# Patient Record
Sex: Male | Born: 1970 | Race: White | Hispanic: No | Marital: Married | Smoking: Never smoker
Health system: Southern US, Community
[De-identification: ages and names within clinical notes are randomized; demographics above are authoritative.]

## PROBLEM LIST (undated history)

## (undated) DIAGNOSIS — K219 Gastro-esophageal reflux disease without esophagitis: Secondary | ICD-10-CM

## (undated) DIAGNOSIS — Z86010 Personal history of colonic polyps: Secondary | ICD-10-CM

## (undated) DIAGNOSIS — Z9889 Other specified postprocedural states: Secondary | ICD-10-CM

## (undated) HISTORY — PX: COLONOSCOPY: SHX174

## (undated) HISTORY — DX: Other specified postprocedural states: Z98.890

## (undated) HISTORY — DX: Personal history of colonic polyps: Z86.010

## (undated) HISTORY — PX: KNEE ARTHROSCOPY W/ ACL RECONSTRUCTION: SHX1858

## (undated) HISTORY — DX: Gastro-esophageal reflux disease without esophagitis: K21.9

## (undated) HISTORY — PX: POLYPECTOMY: SHX149

---

## 2001-05-31 ENCOUNTER — Emergency Department (HOSPITAL_COMMUNITY): Admission: EM | Admit: 2001-05-31 | Discharge: 2001-05-31 | Payer: Self-pay | Admitting: Emergency Medicine

## 2001-05-31 ENCOUNTER — Encounter: Payer: Self-pay | Admitting: Emergency Medicine

## 2004-06-24 HISTORY — PX: COLONOSCOPY W/ POLYPECTOMY: SHX1380

## 2013-05-28 ENCOUNTER — Encounter: Payer: Self-pay | Admitting: Family Medicine

## 2013-05-28 ENCOUNTER — Ambulatory Visit: Payer: Self-pay | Admitting: Family Medicine

## 2013-05-28 VITALS — BP 122/84 | HR 76 | Temp 97.7°F | Resp 16 | Ht 68.5 in | Wt 179.4 lb

## 2013-05-28 DIAGNOSIS — Z0000 Encounter for general adult medical examination without abnormal findings: Secondary | ICD-10-CM

## 2013-05-28 DIAGNOSIS — Z0289 Encounter for other administrative examinations: Secondary | ICD-10-CM

## 2013-05-28 NOTE — Progress Notes (Signed)
Physical Exam  History: 42 year old man here for a physical examination. He has no major acute medical complaints. He's been healthy. 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 He does exercise. He is married and has children  Review of systems: All systems negative  Physical examination: Well-developed well-nourished man in no acute distress. TMs normal. Eyes PERR LA. Fundi benign. He just got glasses. His throat is clear. Neck supple without nodes thyromegaly. No carotid bruits. Chest clear to auscultation. Heart regular without murmurs gallops or arrhythmias. Abdomen soft without mass or tenderness. Normal male external genitalia. Testes descended. No hernias. Spine normal. Extremities unremarkable. Deep tendon reflexes symmetrical. Skin unremarkable.  Assessment: Normal physical examination 2 year DOT card  Plan: Paperwork for DOT

## 2015-06-06 ENCOUNTER — Ambulatory Visit (INDEPENDENT_AMBULATORY_CARE_PROVIDER_SITE_OTHER): Payer: Self-pay | Admitting: Family Medicine

## 2015-06-06 VITALS — BP 120/82 | HR 65 | Temp 97.6°F | Resp 16 | Ht 68.5 in | Wt 187.0 lb

## 2015-06-06 DIAGNOSIS — Z021 Encounter for pre-employment examination: Secondary | ICD-10-CM

## 2015-06-06 DIAGNOSIS — Z0000 Encounter for general adult medical examination without abnormal findings: Secondary | ICD-10-CM

## 2015-06-06 NOTE — Progress Notes (Signed)
This a 44 year old gentleman who works for himself and needs a DOT physical keep his DOT license. He's having no problems. He only uses glasses for corrective vision.  Objective: Please see form that is negative for review of systems and includes a normal physical  Assessment: Two-year certificate, needs glasses  Signed, Carola Frost.D.

## 2016-07-23 ENCOUNTER — Ambulatory Visit (INDEPENDENT_AMBULATORY_CARE_PROVIDER_SITE_OTHER): Payer: Self-pay | Admitting: Physician Assistant

## 2016-07-23 VITALS — BP 132/88 | HR 62 | Temp 98.1°F | Resp 18 | Ht 68.5 in | Wt 185.0 lb

## 2016-07-23 DIAGNOSIS — Z0289 Encounter for other administrative examinations: Secondary | ICD-10-CM

## 2016-07-23 NOTE — Patient Instructions (Signed)
     IF you received an x-ray today, you will receive an invoice from [facility]. Please contact [facility] at [phone] with questions or concerns regarding your invoice.   IF you received labwork today, you will receive an invoice from [facility]. Please contact [facility] at [phone] with questions or concerns regarding your invoice.   Our billing staff will not be able to assist you with questions regarding bills from these companies.  You will be contacted with the lab results as soon as they are available. The fastest way to get your results is to activate your My Chart account. Instructions are located on the last page of this paperwork. If you have not heard from us regarding the results in 2 weeks, please contact this office.     

## 2016-07-23 NOTE — Progress Notes (Signed)
Commercial Driver Medical Examination   Justin Kemp is a 46 y.o. male who presents today for a commercial driver fitness determination physical exam. The patient reports no problems today. In the past the patient reports receiving 2 year certificates. He denies focal neurological deficits, vision and hearing changes. He denies the habitual use of benzodiazepines and opioids.  No past medical history on file.  Current medications, family history, allergies, social history reviewed by me and exist elsewhere in the encounter.   Review of Systems  Constitutional: Negative for fever and malaise/fatigue.  Respiratory: Negative for cough.   Cardiovascular: Negative for chest pain.  Gastrointestinal: Negative for nausea.  Skin: Negative for rash.  Neurological: Negative for dizziness, tingling, tremors, sensory change, speech change, focal weakness, seizures and headaches.  Endo/Heme/Allergies: Negative for polydipsia.    Objective:     Vision/hearing:  Visual Acuity Screening   Right eye Left eye Both eyes  Without correction:     With correction: 20/20 -1 20/25  20/200 -1  Comments: Titmus 85% Colors 6/6  Hearing Screening Comments: Whisper test 9 ft  Applicant can recognize and distinguish among traffic control signals and devices showing standard red, green, and amber colors.  Corrective lenses required: Yes  Monocular Vision?: No  Hearing aid requirement: No  Physical Exam  Constitutional: He is oriented to person, place, and time. He appears well-developed. He does not appear ill.  Eyes: Conjunctivae and EOM are normal. Pupils are equal, round, and reactive to light.  Cardiovascular: Normal rate and regular rhythm.   Pulmonary/Chest: Effort normal.  Abdominal: He exhibits no distension.  Musculoskeletal: Normal range of motion.  Neurological: He is alert and oriented to person, place, and time. No cranial nerve deficit. Coordination normal.  Skin: Skin is warm and  dry. He is not diaphoretic.  Psychiatric: He has a normal mood and affect.  Nursing note and vitals reviewed.   BP 132/88   Pulse 62   Temp 98.1 F (36.7 C) (Oral)   Resp 18   Ht 5' 8.5" (1.74 m)   Wt 185 lb (83.9 kg)   SpO2 97%   BMI 27.72 kg/m   Labs: Comments: Urinalysis: Blood - NEG, Glucose - NEG, Protein - NEG, SG - 1.010  Assessment:    Healthy male exam.  Meets standards in 2 CFR 391.41;  qualifies for 2 year certificate.    Plan:    Medical examiners certificate completed and printed. Return as needed.

## 2016-08-12 ENCOUNTER — Ambulatory Visit (INDEPENDENT_AMBULATORY_CARE_PROVIDER_SITE_OTHER): Payer: Managed Care, Other (non HMO) | Admitting: Physician Assistant

## 2016-08-12 ENCOUNTER — Encounter: Payer: Self-pay | Admitting: Physician Assistant

## 2016-08-12 VITALS — BP 124/79 | HR 70 | Temp 98.5°F | Resp 18 | Ht 68.5 in | Wt 180.6 lb

## 2016-08-12 DIAGNOSIS — Z131 Encounter for screening for diabetes mellitus: Secondary | ICD-10-CM

## 2016-08-12 DIAGNOSIS — Z1329 Encounter for screening for other suspected endocrine disorder: Secondary | ICD-10-CM

## 2016-08-12 DIAGNOSIS — Z114 Encounter for screening for human immunodeficiency virus [HIV]: Secondary | ICD-10-CM

## 2016-08-12 DIAGNOSIS — Z13220 Encounter for screening for lipoid disorders: Secondary | ICD-10-CM

## 2016-08-12 DIAGNOSIS — Z860100 Personal history of colon polyps, unspecified: Secondary | ICD-10-CM

## 2016-08-12 DIAGNOSIS — Z0000 Encounter for general adult medical examination without abnormal findings: Secondary | ICD-10-CM

## 2016-08-12 DIAGNOSIS — Z1211 Encounter for screening for malignant neoplasm of colon: Secondary | ICD-10-CM

## 2016-08-12 DIAGNOSIS — Z130 Encounter for screening for diseases of the blood and blood-forming organs and certain disorders involving the immune mechanism: Secondary | ICD-10-CM | POA: Diagnosis not present

## 2016-08-12 DIAGNOSIS — Z1389 Encounter for screening for other disorder: Secondary | ICD-10-CM | POA: Diagnosis not present

## 2016-08-12 DIAGNOSIS — Z9889 Other specified postprocedural states: Secondary | ICD-10-CM | POA: Diagnosis not present

## 2016-08-12 DIAGNOSIS — Z800 Family history of malignant neoplasm of digestive organs: Secondary | ICD-10-CM

## 2016-08-12 HISTORY — DX: Personal history of colon polyps, unspecified: Z86.0100

## 2016-08-12 HISTORY — DX: Personal history of colonic polyps: Z86.010

## 2016-08-12 NOTE — Progress Notes (Signed)
08/12/2016 2:03 PM   DOB: 1970/07/02 / MRN: [id number]  SUBJECTIVE:  Justin Kemp is a 46 y.o. male presenting for annual exam.  Denies family history of prostate cancer, CAD.  Father with a history of colon cancer at age around age 97 and is still alive today.  Pt had a colonoscopy at age 65 due to father and he had three polyps removed.  Is very concerned about his risk. Mother with questionable lupus vs. Fibromyalgia.  Never smoker. Sexually active with wife of 25 years.  Two boys at age 93 and 2.  Everyone is healthy in the family.  He is happy in his relationship with his wife however there is some stress.   Depression screen PHQ 2/9 08/12/2016  Decreased Interest 0  Down, Depressed, Hopeless 0  PHQ - 2 Score 0    He has No Known Allergies.   He drinks one to two beers weekly.  No illicit drugs.   Review of Systems  Constitutional: Negative for chills and fever.  Cardiovascular: Negative for chest pain.  Gastrointestinal: Negative for abdominal pain, heartburn and nausea.  Genitourinary: Negative for dysuria, frequency and urgency.  Musculoskeletal: Negative for myalgias.  Skin: Negative for rash.  Neurological: Negative for dizziness.  Psychiatric/Behavioral: Negative for depression. The patient is not nervous/anxious.     The problem list and medications were reviewed and updated by myself where necessary and exist elsewhere in the encounter.   OBJECTIVE:  BP 124/79   Pulse 70   Temp 98.5 F (36.9 C) (Oral)   Resp 18   Ht 5' 8.5" (1.74 m)   Wt 180 lb 9.6 oz (81.9 kg)   SpO2 98%   BMI 27.06 kg/m   Wt Readings from Last 3 Encounters:  08/12/16 180 lb 9.6 oz (81.9 kg)  07/23/16 185 lb (83.9 kg)  06/06/15 187 lb (84.8 kg)     Physical Exam  Constitutional: He is oriented to person, place, and time. He appears well-developed and well-nourished. No distress.  HENT:  Mouth/Throat: No oropharyngeal exudate.  Cardiovascular: Normal rate, regular rhythm and normal  heart sounds.  Exam reveals no gallop and no friction rub.   No murmur heard. Pulmonary/Chest: Effort normal and breath sounds normal.  Abdominal: Soft. Bowel sounds are normal.  Musculoskeletal: Normal range of motion.  Neurological: He is alert and oriented to person, place, and time.  Skin: Skin is warm and dry. He is not diaphoretic.  Psychiatric: He has a normal mood and affect.    No results found for this or any previous visit (from the past 72 hour(s)).  No results found.  ASSESSMENT AND PLAN:  Ewald was seen today for annual exam.  Diagnoses and all orders for this visit:  Annual physical exam  Screening for deficiency anemia -     CBC  Screening for nephropathy -     CMP14+EGFR  Screening for lipid disorders -     Lipid panel  Screening for thyroid disorder -     TSH  Screening for diabetes mellitus -     Hemoglobin A1c  Screening for HIV (human immunodeficiency virus) -     HIV antibody  Special screening for malignant neoplasms, colon: father with colon cancer at age 8 or so still alive today.  Patient with history of three polyps removed about 10 years ago.  I am sending him back for repeat.        -     Ambulatory referral to Gastroenterology  The patient is advised to call or return to clinic if he does not see an improvement in symptoms, or to seek the care of the closest emergency department if he worsens with the above plan.   Philis Fendt, MHS, PA-C [facility] 08/12/2016 2:03 PM

## 2016-08-12 NOTE — Patient Instructions (Signed)
     IF you received an x-ray today, you will receive an invoice from [facility]. Please contact [facility] at [phone] with questions or concerns regarding your invoice.   IF you received labwork today, you will receive an invoice from [facility]. Please contact [facility] at [phone] with questions or concerns regarding your invoice.   Our billing staff will not be able to assist you with questions regarding bills from these companies.  You will be contacted with the lab results as soon as they are available. The fastest way to get your results is to activate your My Chart account. Instructions are located on the last page of this paperwork. If you have not heard from us regarding the results in 2 weeks, please contact this office.     

## 2016-08-13 LAB — CMP14+EGFR
ALT: 25 IU/L (ref 0–44)
AST: 23 IU/L (ref 0–40)
Albumin/Globulin Ratio: 1.7 (ref 1.2–2.2)
Albumin: 4.5 g/dL (ref 3.5–5.5)
Alkaline Phosphatase: 74 IU/L (ref 39–117)
BUN/Creatinine Ratio: 12 (ref 9–20)
BUN: 13 mg/dL (ref 6–24)
Bilirubin Total: 0.3 mg/dL (ref 0.0–1.2)
CO2: 24 mmol/L (ref 18–29)
Calcium: 9.7 mg/dL (ref 8.7–10.2)
Chloride: 101 mmol/L (ref 96–106)
Creatinine, Ser: 1.11 mg/dL (ref 0.76–1.27)
GFR calc Af Amer: 91 mL/min/{1.73_m2} (ref >59)
GFR calc non Af Amer: 79 mL/min/{1.73_m2} (ref >59)
Globulin, Total: 2.7 g/dL (ref 1.5–4.5)
Glucose: 87 mg/dL (ref 65–99)
Potassium: 4.3 mmol/L (ref 3.5–5.2)
Sodium: 141 mmol/L (ref 134–144)
Total Protein: 7.2 g/dL (ref 6.0–8.5)

## 2016-08-13 LAB — LIPID PANEL
Chol/HDL Ratio: 4.6 ratio units (ref 0.0–5.0)
Cholesterol, Total: 183 mg/dL (ref 100–199)
HDL: 40 mg/dL (ref >39)
LDL Calculated: 121 mg/dL — ABNORMAL HIGH (ref 0–99)
Triglycerides: 111 mg/dL (ref 0–149)
VLDL Cholesterol Cal: 22 mg/dL (ref 5–40)

## 2016-08-13 LAB — HEMOGLOBIN A1C
Est. average glucose Bld gHb Est-mCnc: 103 mg/dL
Hgb A1c MFr Bld: 5.2 % (ref 4.8–5.6)

## 2016-08-13 LAB — TSH: TSH: 1.81 u[IU]/mL (ref 0.450–4.500)

## 2016-08-13 LAB — CBC
Hematocrit: 45.8 % (ref 37.5–51.0)
Hemoglobin: 15.1 g/dL (ref 13.0–17.7)
MCH: 29 pg (ref 26.6–33.0)
MCHC: 33 g/dL (ref 31.5–35.7)
MCV: 88 fL (ref 79–97)
Platelets: 249 10*3/uL (ref 150–379)
RBC: 5.21 x10E6/uL (ref 4.14–5.80)
RDW: 14.2 % (ref 12.3–15.4)
WBC: 5 10*3/uL (ref 3.4–10.8)

## 2016-08-13 LAB — HIV ANTIBODY (ROUTINE TESTING W REFLEX): HIV Screen 4th Generation wRfx: NONREACTIVE

## 2016-08-13 NOTE — Progress Notes (Signed)
These result look great.  RTC in two years for physical exam.

## 2016-09-17 ENCOUNTER — Encounter (INDEPENDENT_AMBULATORY_CARE_PROVIDER_SITE_OTHER): Payer: Self-pay

## 2016-09-17 ENCOUNTER — Encounter: Payer: Self-pay | Admitting: Gastroenterology

## 2016-09-17 ENCOUNTER — Ambulatory Visit (INDEPENDENT_AMBULATORY_CARE_PROVIDER_SITE_OTHER): Payer: Managed Care, Other (non HMO) | Admitting: Gastroenterology

## 2016-09-17 VITALS — BP 100/80 | HR 80 | Ht 68.5 in | Wt 183.5 lb

## 2016-09-17 DIAGNOSIS — Z800 Family history of malignant neoplasm of digestive organs: Secondary | ICD-10-CM | POA: Diagnosis not present

## 2016-09-17 DIAGNOSIS — Z86010 Personal history of colonic polyps: Secondary | ICD-10-CM

## 2016-09-17 MED ORDER — NA SULFATE-K SULFATE-MG SULF 17.5-3.13-1.6 GM/177ML PO SOLN: 1.0000 | Freq: Once | ORAL | 0 refills | Status: AC

## 2016-09-17 NOTE — Progress Notes (Signed)
HPI :  46 y/o male with reported history of colon polyps, knee arthroscopy, family history of colon cancer, here for a new patient visit to discuss having a colonoscopy.   Father had colon cancer at age 5s, he is not sure of exact age. He reportedly had a prior colonoscopy with polyp removed at age 38, he is not sure where this was done. He had 3 polyps removed, he is not sure what type they were.   No blood in the stools althought he thinks he may have hemorrhoids which occasionally causes some blood to be noted on the tissue paper. No constipation or diarrhea. Some foods can reliably cause cause bloating and discomfort at times, if he avoids these he doesn't have any symptoms. No weight loss. Eating okay, no nausea or vomiting, good appetite.  No history of anemia.   Past Medical History:  Diagnosis Date  . H/O colonoscopy with polypectomy 08/12/2016    Past Surgical History:  Procedure Laterality Date  . COLONOSCOPY W/ POLYPECTOMY  2006  . KNEE ARTHROSCOPY W/ ACL RECONSTRUCTION Left    Family History  Problem Relation Age of Onset  . Arthritis Mother   . Colon cancer Father 61  . Cancer Maternal Grandmother     type unknown  . Lung cancer Maternal Grandfather     smoker   Social History  Substance Use Topics  . Smoking status: Never Smoker  . Smokeless tobacco: Never Used  . Alcohol use 1.8 oz/week    3 Cans of beer per week   No current outpatient prescriptions on file.   No current facility-administered medications for this visit.    No Known Allergies   Review of Systems: All systems reviewed and negative except where noted in HPI.   Lab Results  Component Value Date   WBC 5.0 08/12/2016   HCT 45.8 08/12/2016   MCV 88 08/12/2016   PLT 249 08/12/2016    Lab Results  Component Value Date   CREATININE 1.11 08/12/2016   BUN 13 08/12/2016   NA 141 08/12/2016   K 4.3 08/12/2016   CL 101 08/12/2016   CO2 24 08/12/2016    Lab Results  Component Value  Date   ALT 25 08/12/2016   AST 23 08/12/2016   ALKPHOS 74 08/12/2016   BILITOT 0.3 08/12/2016     Physical Exam: BP 100/80 (BP Location: Left Arm, Patient Position: Sitting, Cuff Size: Normal)   Pulse 80   Ht 5' 8.5" (1.74 m) Comment: height measured without shoes  Wt 183 lb 8 oz (83.2 kg)   BMI 27.50 kg/m  Constitutional: Pleasant,well-developed, male in no acute distress. HEENT: Normocephalic and atraumatic. Conjunctivae are normal. No scleral icterus. Neck supple.  Cardiovascular: Normal rate, regular rhythm.  Pulmonary/chest: Effort normal and breath sounds normal. No wheezing, rales or rhonchi. Abdominal: Soft, nondistended, nontender.  There are no masses palpable. No hepatomegaly. Extremities: no edema Lymphadenopathy: No cervical adenopathy noted. Neurological: Alert and oriented to person place and time. Skin: Skin is warm and dry. No rashes noted. Psychiatric: Normal mood and affect. Behavior is normal.   ASSESSMENT AND PLAN: 46 y/o male with a reported history of colon polyps on colonoscopy done roughly 9 years ago (details unknown) with a family history of colon cancer, who is presenting for colonoscopy evaluation. We discussed his family history and guidelines for colon cancer screening recommendations based on family history of colon cancer. We discussed that if his father had colon cancer diagnosed at age >  29, and if he had no adenomas on his last exam, he would not be due for 10 years from his last exam. However, he is not exactly sure what age his father was diagnosed and what his last colonoscopy showed, and wished to proceed with colonoscopy now. Following discussion of risks / benefits of the exam, he agreed to proceed. Will try to obtain records from his prior colonoscopy to clarify findings if possible in the interim.   Monterey Cellar, MD [facility] Pager [phone]  CC: Tereasa Coop, PA-C

## 2016-09-17 NOTE — Patient Instructions (Signed)
If you are age 48 or older, your body mass index should be between 23-30. Your Body mass index is 27.5 kg/m. If this is out of the aforementioned range listed, please consider follow up with your Primary Care Provider.  If you are age 60 or younger, your body mass index should be between 19-25. Your Body mass index is 27.5 kg/m. If this is out of the aformentioned range listed, please consider follow up with your Primary Care Provider.   You have been scheduled for a colonoscopy. Please follow written instructions given to you at your visit today.  Please pick up your prep supplies at the pharmacy within the next 1-3 days. If you use inhalers (even only as needed), please bring them with you on the day of your procedure. Your physician has requested that you go to www.startemmi.com and enter the access code given to you at your visit today. This web site gives a general overview about your procedure. However, you should still follow specific instructions given to you by our office regarding your preparation for the procedure.   We have sent the following medications to your pharmacy for you to pick up at your convenience:  Suprep  Thank you.

## 2016-09-25 ENCOUNTER — Telehealth: Payer: Self-pay | Admitting: Gastroenterology

## 2016-09-25 NOTE — Telephone Encounter (Signed)
Colonoscopy report obtained from prior records as follows:  Date: 05/26/2006 by Dr. Teena Irani Findings: 2 x 44mm polyps, one adenoma, one hyperplastic  Patient has already been scheduled for colonoscopy, as he is overdue for surveillance given his history of an adenoma. We will await the results.

## 2016-10-21 ENCOUNTER — Encounter: Payer: Self-pay | Admitting: Gastroenterology

## 2016-10-31 ENCOUNTER — Encounter: Payer: Self-pay | Admitting: Gastroenterology

## 2016-11-01 ENCOUNTER — Ambulatory Visit (AMBULATORY_SURGERY_CENTER): Payer: Managed Care, Other (non HMO) | Admitting: Gastroenterology

## 2016-11-01 ENCOUNTER — Encounter: Payer: Self-pay | Admitting: Gastroenterology

## 2016-11-01 VITALS — BP 104/69 | HR 68 | Temp 98.2°F | Resp 15 | Ht 68.5 in | Wt 183.0 lb

## 2016-11-01 DIAGNOSIS — Z1212 Encounter for screening for malignant neoplasm of rectum: Secondary | ICD-10-CM

## 2016-11-01 DIAGNOSIS — Z1211 Encounter for screening for malignant neoplasm of colon: Secondary | ICD-10-CM | POA: Diagnosis not present

## 2016-11-01 DIAGNOSIS — K635 Polyp of colon: Secondary | ICD-10-CM

## 2016-11-01 DIAGNOSIS — D128 Benign neoplasm of rectum: Secondary | ICD-10-CM

## 2016-11-01 DIAGNOSIS — Z800 Family history of malignant neoplasm of digestive organs: Secondary | ICD-10-CM

## 2016-11-01 DIAGNOSIS — D123 Benign neoplasm of transverse colon: Secondary | ICD-10-CM | POA: Diagnosis not present

## 2016-11-01 MED ORDER — SODIUM CHLORIDE 0.9 % IV SOLN: 500.0000 mL | INTRAVENOUS | Status: DC

## 2016-11-01 NOTE — Op Note (Signed)
[facility] Patient Name: Justin Kemp Procedure Date: 11/01/2016 3:33 PM MRN: [id number] Endoscopist: Remo Lipps P. Landa [name] MD, MD Age: 46 Referring MD:  Date of Birth: 10/07/1970 Gender: Male Account #: [id number] Procedure:                Colonoscopy Indications:              Screening in patient at increased risk: Family                            history of 1st-degree relative with colorectal                            cancer (father around age 78), Personal history of                            colonic adenomas Medicines:                Monitored Anesthesia Care Procedure:                Pre-Anesthesia Assessment:                           - Prior to the procedure, a History and Physical                            was performed, and patient medications and                            allergies were reviewed. The patient's tolerance of                            previous anesthesia was also reviewed. The risks                            and benefits of the procedure and the sedation                            options and risks were discussed with the patient.                            All questions were answered, and informed consent                            was obtained. Prior Anticoagulants: The patient has                            taken no previous anticoagulant or antiplatelet                            agents. ASA Grade Assessment: II - A patient with                            mild systemic disease. After reviewing the risks  and benefits, the patient was deemed in                            satisfactory condition to undergo the procedure.                           After obtaining informed consent, the colonoscope                            was passed under direct vision. Throughout the                            procedure, the patient's blood pressure, pulse, and                            oxygen saturations were monitored  continuously. The                            Colonoscope was introduced through the anus and                            advanced to the the cecum, identified by                            appendiceal orifice and ileocecal valve. The                            colonoscopy was performed without difficulty. The                            patient tolerated the procedure well. The quality                            of the bowel preparation was good. The ileocecal                            valve, appendiceal orifice, and rectum were                            photographed. Scope In: 3:37:57 PM Scope Out: 4:01:51 PM Scope Withdrawal Time: 0 hours 20 minutes 49 seconds  Total Procedure Duration: 0 hours 23 minutes 54 seconds  Findings:                 The perianal and digital rectal examinations were                            normal.                           A 4 mm polyp was found in the transverse colon. The                            polyp was sessile. The polyp was removed with a  cold snare. Resection and retrieval were complete.                           A 3 mm polyp was found in the rectum. The polyp was                            sessile. The polyp was removed with a cold snare.                            Resection and retrieval were complete.                           A 10 mm polyp was found in the rectum. The polyp                            was flat and not easily able to be snared in                            entirety without lifting. Area was successfully                            injected with saline for a lift polypectomy. The                            polyp was removed with a hot snare en bloc.                            Resection and retrieval were complete.                           The exam was otherwise without abnormality on                            direct and retroflexion views. Complications:            No immediate complications. Estimated  blood loss:                            Minimal. Estimated Blood Loss:     Estimated blood loss was minimal. Impression:               - One 4 mm polyp in the transverse colon, removed                            with a cold snare. Resected and retrieved.                           - One 3 mm polyp in the rectum, removed with a cold                            snare. Resected and retrieved.                           -  One 10 mm polyp in the rectum, removed with a hot                            snare following saline lift. Resected and retrieved.                           - The examination was otherwise normal on direct                            and retroflexion views. Recommendation:           - Patient has a contact number available for                            emergencies. The signs and symptoms of potential                            delayed complications were discussed with the                            patient. Return to normal activities tomorrow.                            Written discharge instructions were provided to the                            patient.                           - Resume previous diet.                           - Continue present medications.                           - Await pathology results.                           - Repeat colonoscopy is recommended for                            surveillance. The colonoscopy date will be                            determined after pathology results from today's                            exam become available for review.                           - No ibuprofen, naproxen, or other non-steroidal                            anti-inflammatory drugs for 2 weeks after polyp  removal. Remo Lipps P. [name] MD, MD 11/01/2016 4:07:08 PM This report has been signed electronically.

## 2016-11-01 NOTE — Patient Instructions (Signed)
Discharge instructions given. Handout on polyps. Resume previous medications. No ibuprofen, naproxen, or other NSAIDs for two weeks. YOU HAD AN ENDOSCOPIC PROCEDURE TODAY AT THE [facility]:   Refer to the procedure report that was given to you for any specific questions about what was found during the examination.  If the procedure report does not answer your questions, please call your gastroenterologist to clarify.  If you requested that your care partner not be given the details of your procedure findings, then the procedure report has been included in a sealed envelope for you to review at your convenience later.  YOU SHOULD EXPECT: Some feelings of bloating in the abdomen. Passage of more gas than usual.  Walking can help get rid of the air that was put into your GI tract during the procedure and reduce the bloating. If you had a lower endoscopy (such as a colonoscopy or flexible sigmoidoscopy) you may notice spotting of blood in your stool or on the toilet paper. If you underwent a bowel prep for your procedure, you may not have a normal bowel movement for a few days.  Please Note:  You might notice some irritation and congestion in your nose or some drainage.  This is from the oxygen used during your procedure.  There is no need for concern and it should clear up in a day or so.  SYMPTOMS TO REPORT IMMEDIATELY:   Following lower endoscopy (colonoscopy or flexible sigmoidoscopy):  Excessive amounts of blood in the stool  Significant tenderness or worsening of abdominal pains  Swelling of the abdomen that is new, acute  Fever of 100F or higher  For urgent or emergent issues, a gastroenterologist can be reached at any hour by calling [phone].   DIET:  We do recommend a small meal at first, but then you may proceed to your regular diet.  Drink plenty of fluids but you should avoid alcoholic beverages for 24 hours.  ACTIVITY:  You should plan to take it easy for the  rest of today and you should NOT DRIVE or use heavy machinery until tomorrow (because of the sedation medicines used during the test).    FOLLOW UP: Our staff will call the number listed on your records the next business day following your procedure to check on you and address any questions or concerns that you may have regarding the information given to you following your procedure. If we do not reach you, we will leave a message.  However, if you are feeling well and you are not experiencing any problems, there is no need to return our call.  We will assume that you have returned to your regular daily activities without incident.  If any biopsies were taken you will be contacted by phone or by letter within the next 1-3 weeks.  Please call us at [phone] if you have not heard about the biopsies in 3 weeks.    SIGNATURES/CONFIDENTIALITY: You and/or your care partner have signed paperwork which will be entered into your electronic medical record.  These signatures attest to the fact that that the information above on your After Visit Summary has been reviewed and is understood.  Full responsibility of the confidentiality of this discharge information lies with you and/or your care-partner. 

## 2016-11-01 NOTE — Progress Notes (Signed)
Pt's states no medical or surgical changes since previsit or office visit. 

## 2016-11-01 NOTE — Progress Notes (Signed)
Called to room to assist during endoscopic procedure.  Patient ID and intended procedure confirmed with present staff. Received instructions for my participation in the procedure from the performing physician.  

## 2016-11-01 NOTE — Progress Notes (Signed)
A/ox3 pleased with MAC, report to Celia RN 

## 2016-11-04 ENCOUNTER — Telehealth: Payer: Self-pay

## 2016-11-04 NOTE — Telephone Encounter (Signed)
  Follow up Call-  Call back number 11/01/2016  Post procedure Call Back phone  # [phone]  Permission to leave phone message Yes  Some recent data might be hidden     Patient questions:  Do you have a fever, pain , or abdominal swelling? No. Pain Score  0 *  Have you tolerated food without any problems? Yes.    Have you been able to return to your normal activities? Yes.    Do you have any questions about your discharge instructions: Diet   No. Medications  No. Follow up visit  No.  Do you have questions or concerns about your Care? No.  Actions: * If pain score is 4 or above: No action needed, pain <4.

## 2016-11-27 ENCOUNTER — Encounter: Payer: Self-pay | Admitting: Gastroenterology

## 2016-12-30 ENCOUNTER — Telehealth: Payer: Self-pay | Admitting: Physician Assistant

## 2016-12-30 NOTE — Telephone Encounter (Signed)
Spoke with patient, states that he needs physical form for the [employer] filled out. Patient had a physical 08/12/2016, would like to know if form can be filled out based on that visit. Spoke with PA Bess Harvest who is covering for [employer], agreed to fill out and sign form. Form completed, call placed to patient to pick up. Patient states that he will pick up today./ S.Jvon Meroney,CMA

## 2016-12-30 NOTE — Telephone Encounter (Signed)
Pt requesting Justin Kemp complete form from his recent [employer]   Best phone for pt is [phone]

## 2016-12-31 ENCOUNTER — Ambulatory Visit (INDEPENDENT_AMBULATORY_CARE_PROVIDER_SITE_OTHER): Payer: Managed Care, Other (non HMO) | Admitting: Urgent Care

## 2016-12-31 ENCOUNTER — Encounter: Payer: Self-pay | Admitting: Urgent Care

## 2016-12-31 VITALS — BP 118/75 | HR 62 | Temp 98.1°F | Resp 18 | Ht 68.5 in | Wt 182.8 lb

## 2016-12-31 DIAGNOSIS — Z23 Encounter for immunization: Secondary | ICD-10-CM | POA: Diagnosis not present

## 2016-12-31 NOTE — Progress Notes (Signed)
Patient was seen on 07/2016 for an complete physical with Philis Fendt, PA-C.  He returned today to have his Tdap immunization that was needed for [employer].  Immunization was an add on to previous OV of CPE.  Patient was made aware that he may experience soreness of the arm where the injection was placed.  He verbalized understanding. Denied any medication allergies.  Patient received injection in right deltoid and had no complications afterwards before leaving office.

## 2016-12-31 NOTE — Patient Instructions (Signed)
     IF you received an x-ray today, you will receive an invoice from [facility]. Please contact [facility] at [phone] with questions or concerns regarding your invoice.   IF you received labwork today, you will receive an invoice from [place]. Please contact [facility] at [phone] with questions or concerns regarding your invoice.   Our billing staff will not be able to assist you with questions regarding bills from these companies.  You will be contacted with the lab results as soon as they are available. The fastest way to get your results is to activate your My Chart account. Instructions are located on the last page of this paperwork. If you have not heard from [place] regarding the results in 2 weeks, please contact this office.     il

## 2017-01-28 ENCOUNTER — Ambulatory Visit (AMBULATORY_SURGERY_CENTER): Payer: Self-pay

## 2017-01-28 VITALS — Ht 68.0 in | Wt 186.0 lb

## 2017-01-28 DIAGNOSIS — Z860100 Personal history of colon polyps, unspecified: Secondary | ICD-10-CM

## 2017-01-28 NOTE — Progress Notes (Signed)
No allergies to eggs or soy No diet meds No home oxygen No past problems with anesthesia  Declined emmi 

## 2017-01-30 ENCOUNTER — Encounter: Payer: Self-pay | Admitting: Gastroenterology

## 2017-02-10 ENCOUNTER — Ambulatory Visit (AMBULATORY_SURGERY_CENTER): Payer: Managed Care, Other (non HMO) | Admitting: Gastroenterology

## 2017-02-10 ENCOUNTER — Encounter: Payer: Self-pay | Admitting: Gastroenterology

## 2017-02-10 VITALS — BP 101/74 | HR 71 | Temp 98.6°F | Resp 11 | Ht 68.0 in | Wt 186.0 lb

## 2017-02-10 DIAGNOSIS — Z800 Family history of malignant neoplasm of digestive organs: Secondary | ICD-10-CM

## 2017-02-10 DIAGNOSIS — Z86010 Personal history of colonic polyps: Secondary | ICD-10-CM

## 2017-02-10 DIAGNOSIS — K6289 Other specified diseases of anus and rectum: Secondary | ICD-10-CM | POA: Diagnosis not present

## 2017-02-10 MED ORDER — SODIUM CHLORIDE 0.9 % IV SOLN: 500.0000 mL | INTRAVENOUS | Status: DC

## 2017-02-10 NOTE — Progress Notes (Signed)
Pt's states no medical or surgical changes since previsit or office visit. 

## 2017-02-10 NOTE — Op Note (Signed)
[facility] Patient Name: Justin Kemp Procedure Date: 02/10/2017 8:29 AM MRN: [id number] Endoscopist: Remo Lipps P. Cyncere Sontag MD, MD Age: 46 Referring MD:  Date of Birth: 1970/07/09 Gender: Male Account #: [id number] Procedure:                Flexible Sigmoidoscopy Indications:              High risk colon cancer surveillance: Personal                            history of colonic polyps - 1cm flat polyp with                            high grade dysplasia removed from the rectum 3                            months ago Medicines:                Monitored Anesthesia Care Procedure:                Pre-Anesthesia Assessment:                           - Prior to the procedure, a History and Physical                            was performed, and patient medications and                            allergies were reviewed. The patient's tolerance of                            previous anesthesia was also reviewed. The risks                            and benefits of the procedure and the sedation                            options and risks were discussed with the patient.                            All questions were answered, and informed consent                            was obtained. Prior Anticoagulants: The patient has                            taken no previous anticoagulant or antiplatelet                            agents. ASA Grade Assessment: I - A normal, healthy                            patient. After reviewing the risks and benefits,  the patient was deemed in satisfactory condition to                            undergo the procedure.                           After obtaining informed consent, the scope was                            passed under direct vision. The Model PCF-H190DL                            [id number]) scope was introduced through the anus                            and advanced to the the sigmoid colon. The flexible                         sigmoidoscopy was accomplished without difficulty.                            The patient tolerated the procedure well. The                            quality of the bowel preparation was adequate. Scope In: 8:34:33 AM Scope Out: 8:44:00 AM Total Procedure Duration: 0 hours 9 minutes 27 seconds  Findings:                 The perianal and digital rectal examinations were                            normal.                           A medium post polypectomy scar was found in the                            distal rectum. The scar tissue was healthy in                            appearance, no obvious residual polyp. Biopsies                            were taken from the base of the scar with a cold                            forceps for histology. With the polypectomy site                            placed at the 6 o'clock position, a tattoo was                            placed to the left lateral side of the area with  spot ink.                           The exam was otherwise normal throughout the                            examined colon (sigmoid colon reached). Complications:            No immediate complications. Estimated blood loss:                            Minimal. Estimated Blood Loss:     Estimated blood loss was minimal. Impression:               - Post-polypectomy scar in the rectum - site                            appeared healthy with no evidence of residual                            polyp. Biopsied. Tattooed. Recommendation:           - Discharge patient to home.                           - Resume previous diet.                           - Continue present medications.                           - Await pathology results.                           - Repeat colonoscopy in 3 years pending pathology                            results normal Jinny Sweetland P. Bita Cartwright MD, MD 02/10/2017 8:50:11 AM This report has been signed  electronically.

## 2017-02-10 NOTE — Progress Notes (Signed)
Called to room to assist during endoscopic procedure.  Patient ID and intended procedure confirmed with present staff. Received instructions for my participation in the procedure from the performing physician.  

## 2017-02-10 NOTE — Progress Notes (Signed)
To recovery, report to RN, VSS. 

## 2017-02-10 NOTE — Patient Instructions (Signed)
Biopsies taken today. Result letter in your mail in 2-3 weeks. Repeat colonoscopy in 3 years depending on path results. Call us with any questions or concerns. Thank you!   YOU HAD AN ENDOSCOPIC PROCEDURE TODAY AT [facility]:   Refer to the procedure report that was given to you for any specific questions about what was found during the examination.  If the procedure report does not answer your questions, please call your gastroenterologist to clarify.  If you requested that your care partner not be given the details of your procedure findings, then the procedure report has been included in a sealed envelope for you to review at your convenience later.  YOU SHOULD EXPECT: Some feelings of bloating in the abdomen. Passage of more gas than usual.  Walking can help get rid of the air that was put into your GI tract during the procedure and reduce the bloating. If you had a lower endoscopy (such as a colonoscopy or flexible sigmoidoscopy) you may notice spotting of blood in your stool or on the toilet paper. If you underwent a bowel prep for your procedure, you may not have a normal bowel movement for a few days.  Please Note:  You might notice some irritation and congestion in your nose or some drainage.  This is from the oxygen used during your procedure.  There is no need for concern and it should clear up in a day or so.  SYMPTOMS TO REPORT IMMEDIATELY:   Following lower endoscopy (colonoscopy or flexible sigmoidoscopy):  Excessive amounts of blood in the stool  Significant tenderness or worsening of abdominal pains  Swelling of the abdomen that is new, acute  Fever of 100F or higher  For urgent or emergent issues, a gastroenterologist can be reached at any hour by calling [phone].   DIET:  We do recommend a small meal at first, but then you may proceed to your regular diet.  Drink plenty of fluids but you should avoid alcoholic beverages for 24 hours.  ACTIVITY:   You should plan to take it easy for the rest of today and you should NOT DRIVE or use heavy machinery until tomorrow (because of the sedation medicines used during the test).    FOLLOW UP: Our staff will call the number listed on your records the next business day following your procedure to check on you and address any questions or concerns that you may have regarding the information given to you following your procedure. If we do not reach you, we will leave a message.  However, if you are feeling well and you are not experiencing any problems, there is no need to return our call.  We will assume that you have returned to your regular daily activities without incident.  If any biopsies were taken you will be contacted by phone or by letter within the next 1-3 weeks.  Please call us at [phone] if you have not heard about the biopsies in 3 weeks.    SIGNATURES/CONFIDENTIALITY: You and/or your care partner have signed paperwork which will be entered into your electronic medical record.  These signatures attest to the fact that that the information above on your After Visit Summary has been reviewed and is understood.  Full responsibility of the confidentiality of this discharge information lies with you and/or your care-partner.

## 2017-02-11 ENCOUNTER — Telehealth: Payer: Self-pay | Admitting: *Deleted

## 2017-02-11 NOTE — Telephone Encounter (Signed)
  Follow up Call-  Call back number 02/10/2017 11/01/2016  Post procedure Call Back phone  # [phone]  Permission to leave phone message Yes Yes  Some recent data might be hidden     Patient questions:  Do you have a fever, pain , or abdominal swelling? No. Pain Score  0 *  Have you tolerated food without any problems? Yes.    Have you been able to return to your normal activities? Yes.    Do you have any questions about your discharge instructions: Diet   No. Medications  No. Follow up visit  No.  Do you have questions or concerns about your Care? No.  Actions: * If pain score is 4 or above: No action needed, pain <4.

## 2017-02-13 ENCOUNTER — Encounter: Payer: Self-pay | Admitting: Gastroenterology

## 2017-11-21 ENCOUNTER — Ambulatory Visit (INDEPENDENT_AMBULATORY_CARE_PROVIDER_SITE_OTHER): Payer: Managed Care, Other (non HMO)

## 2017-11-21 ENCOUNTER — Other Ambulatory Visit: Payer: Self-pay

## 2017-11-21 ENCOUNTER — Ambulatory Visit: Payer: Managed Care, Other (non HMO) | Admitting: Physician Assistant

## 2017-11-21 ENCOUNTER — Encounter: Payer: Self-pay | Admitting: Physician Assistant

## 2017-11-21 VITALS — BP 110/80 | HR 74 | Temp 97.7°F | Resp 18 | Ht 68.0 in | Wt 183.6 lb

## 2017-11-21 DIAGNOSIS — R109 Unspecified abdominal pain: Secondary | ICD-10-CM | POA: Diagnosis not present

## 2017-11-21 LAB — POCT URINALYSIS DIP (MANUAL ENTRY)
BILIRUBIN UA: NEGATIVE
Ketones, POC UA: NEGATIVE mg/dL
Leukocytes, UA: NEGATIVE
Nitrite, UA: NEGATIVE
Protein Ur, POC: NEGATIVE mg/dL
RBC UA: NEGATIVE
SPEC GRAV UA: 1.025 (ref 1.010–1.025)
Urobilinogen, UA: 0.2 E.U./dL
pH, UA: 5.5 (ref 5.0–8.0)

## 2017-11-21 LAB — POCT GLYCOSYLATED HEMOGLOBIN (HGB A1C): Hemoglobin A1C: 5.8 % — AB (ref 4.0–5.6)

## 2017-11-21 LAB — POC MICROSCOPIC URINALYSIS (UMFC): Mucus: ABSENT

## 2017-11-21 NOTE — Progress Notes (Signed)
11/21/2017 3:13 PM   DOB: Feb 21, 1971 / MRN: [id number]  SUBJECTIVE:  Justin Kemp is a 47 y.o. male presenting for left sided flank pain times two weeks.  No pleuritic component, SOB, DOE, cough, nausea, chills.  No hematuria.  No new meds.  No GERD or bowel disturbance. Never smoker.  The 10-year ASCVD risk score [name] DC [name]., et al., 2013) is: 2.4%   Values used to calculate the score:     Age: 7 years     Sex: Male     Is Non-Hispanic African American: No     Diabetic: No     Tobacco smoker: No     Systolic Blood Pressure: 834 mmHg     Is BP treated: No     HDL Cholesterol: 40 mg/dL     Total Cholesterol: 183 mg/dL   He has No Known Allergies.   He  has a past medical history of H/O colonoscopy with polypectomy (08/12/2016).    He  reports that he has never smoked. He has never used smokeless tobacco. He reports that he drinks about 1.8 oz of alcohol per week. He reports that he does not use drugs. He  has no sexual activity history on file. The patient  has a past surgical history that includes Colonoscopy w/ polypectomy (2006) and Knee arthroscopy w/ ACL reconstruction (Left).  His family history includes Arthritis in his mother; Cancer in his maternal grandmother; Colon cancer (age of onset: 21) in his father; Lung cancer in his maternal grandfather.  Review of Systems  Constitutional: Negative for chills, diaphoresis and fever.  Eyes: Negative.   Respiratory: Negative for cough, hemoptysis, sputum production, shortness of breath and wheezing.   Cardiovascular: Negative for chest pain, orthopnea and leg swelling.  Gastrointestinal: Negative for abdominal pain, blood in stool, constipation, diarrhea, heartburn, melena, nausea and vomiting.  Genitourinary: Positive for flank pain. Negative for dysuria, frequency, hematuria and urgency.  Skin: Negative for rash.  Neurological: Negative for dizziness, sensory change, speech change, focal weakness and headaches.    The  problem list and medications were reviewed and updated by myself where necessary and exist elsewhere in the encounter.   OBJECTIVE:  BP 110/80 (BP Location: Right Arm, Patient Position: Sitting, Cuff Size: Normal)   Pulse 74   Temp 97.7 F (36.5 C) (Oral)   Resp 18   Ht 5\' 8"  (1.727 m)   Wt 183 lb 9.6 oz (83.3 kg)   SpO2 98%   BMI 27.92 kg/m   Wt Readings from Last 3 Encounters:  11/21/17 183 lb 9.6 oz (83.3 kg)  02/10/17 186 lb (84.4 kg)  01/28/17 186 lb (84.4 kg)   Temp Readings from Last 3 Encounters:  11/21/17 97.7 F (36.5 C) (Oral)  02/10/17 98.6 F (37 C) (Temporal)  12/31/16 98.1 F (36.7 C) (Oral)   BP Readings from Last 3 Encounters:  11/21/17 110/80  02/10/17 101/74  12/31/16 118/75   Pulse Readings from Last 3 Encounters:  11/21/17 74  02/10/17 71  12/31/16 62     Physical Exam  Constitutional: He is oriented to person, place, and time. He appears well-developed. He is active.  Non-toxic appearance. He does not appear ill.  Eyes: Pupils are equal, round, and reactive to light. Conjunctivae and EOM are normal.  Cardiovascular: Normal rate, regular rhythm, S1 normal, S2 normal, normal heart sounds, intact distal pulses and normal pulses. Exam reveals no gallop and no friction rub.  No murmur heard. Pulmonary/Chest: Effort  normal. No stridor. No respiratory distress. He has no wheezes. He has no rales.  Abdominal: He exhibits no distension.  Musculoskeletal: Normal range of motion. He exhibits no edema.  Neurological: He is alert and oriented to person, place, and time. He has normal strength and normal reflexes. He is not disoriented. No cranial nerve deficit or sensory deficit. He exhibits normal muscle tone. Coordination and gait normal.  Skin: Skin is warm and dry. He is not diaphoretic. No pallor.  Psychiatric: He has a normal mood and affect. His behavior is normal.  Nursing note and vitals reviewed.   Results for orders placed or performed in  visit on 11/21/17 (from the past 72 hour(s))  POCT urinalysis dipstick     Status: Abnormal   Collection Time: 11/21/17  9:56 AM  Result Value Ref Range   Color, UA yellow yellow   Clarity, UA clear clear   Glucose, UA =100 (A) negative mg/dL   Bilirubin, UA negative negative   Ketones, POC UA negative negative mg/dL   Spec Grav, UA 1.025 1.010 - 1.025   Blood, UA negative negative   pH, UA 5.5 5.0 - 8.0   Protein Ur, POC negative negative mg/dL   Urobilinogen, UA 0.2 0.2 or 1.0 E.U./dL   Nitrite, UA Negative Negative   Leukocytes, UA Negative Negative  POCT glycosylated hemoglobin (Hb A1C)     Status: Abnormal   Collection Time: 11/21/17 10:16 AM  Result Value Ref Range   Hemoglobin A1C 5.8 (A) 4.0 - 5.6 %   HbA1c, POC (prediabetic range)  5.7 - 6.4 %   HbA1c, POC (controlled diabetic range)  0.0 - 7.0 %  POCT Microscopic Urinalysis ([facility])     Status: None   Collection Time: 11/21/17  1:51 PM  Result Value Ref Range   WBC,UR,HPF,POC None None WBC/hpf   RBC,UR,HPF,POC None None RBC/hpf   Bacteria None None, Too numerous to count   Mucus Absent Absent   Epithelial Cells, UR Per Microscopy None None, Too numerous to count cells/hpf    Dg Abd 1 View  Result Date: 11/21/2017 CLINICAL DATA:  Flank pain, 2 weeks duration.  Side not specified. EXAM: ABDOMEN - 1 VIEW COMPARISON:  None. FINDINGS: Bowel gas pattern is within normal limits. No abnormal calcifications or bone findings. IMPRESSION: Normal radiographs. No evidence of stone disease. No cause of flank pain identified. Electronically Signed   By: Nelson Chimes M.D.   On: 11/21/2017 09:41    ASSESSMENT AND PLAN:  Justin Kemp was seen today for flank pain.  Diagnoses and all orders for this visit:  Flank pain: No overt cause.  Some glucosuria however no diabetes and no proteinuira.  Favor watchful waiting if labs continue to come back normal. He will call in two week if the symptoms persist and I will order a CT Abdomen with  contrast.  -     CBC -     CMP and Liver -     DG Abd 1 View; Future -     POCT urinalysis dipstick -     POCT glycosylated hemoglobin (Hb A1C) -     POCT Microscopic Urinalysis ([facility])    The patient is advised to call or return to clinic if he does not see an improvement in symptoms, or to seek the care of the closest emergency department if he worsens with the above plan.   Philis Fendt, MHS, PA-C [facility] 11/21/2017 3:13 PM

## 2017-11-22 LAB — CMP AND LIVER
ALBUMIN: 4.6 g/dL (ref 3.5–5.5)
ALT: 20 IU/L (ref 0–44)
AST: 17 IU/L (ref 0–40)
Alkaline Phosphatase: 78 IU/L (ref 39–117)
BILIRUBIN TOTAL: 0.3 mg/dL (ref 0.0–1.2)
BUN: 15 mg/dL (ref 6–24)
Bilirubin, Direct: 0.09 mg/dL (ref 0.00–0.40)
CHLORIDE: 104 mmol/L (ref 96–106)
CO2: 20 mmol/L (ref 20–29)
Calcium: 9.7 mg/dL (ref 8.7–10.2)
Creatinine, Ser: 1.02 mg/dL (ref 0.76–1.27)
GFR, EST AFRICAN AMERICAN: 100 mL/min/{1.73_m2} (ref >59)
GFR, EST NON AFRICAN AMERICAN: 87 mL/min/{1.73_m2} (ref >59)
Glucose: 81 mg/dL (ref 65–99)
POTASSIUM: 4.2 mmol/L (ref 3.5–5.2)
Sodium: 139 mmol/L (ref 134–144)
Total Protein: 7 g/dL (ref 6.0–8.5)

## 2017-11-22 LAB — CBC
HEMATOCRIT: 44.3 % (ref 37.5–51.0)
Hemoglobin: 15.3 g/dL (ref 13.0–17.7)
MCH: 30.5 pg (ref 26.6–33.0)
MCHC: 34.5 g/dL (ref 31.5–35.7)
MCV: 88 fL (ref 79–97)
PLATELETS: 245 10*3/uL (ref 150–450)
RBC: 5.01 x10E6/uL (ref 4.14–5.80)
RDW: 13.9 % (ref 12.3–15.4)
WBC: 4.6 10*3/uL (ref 3.4–10.8)

## 2017-11-22 NOTE — Progress Notes (Signed)
Please make patient aware of results via letter. In the context of his overall presentation any abnormal values are of no clinical significance.  Philis Fendt PA-C, 11/22/2017 8:10 AM

## 2017-11-24 ENCOUNTER — Encounter: Payer: Self-pay | Admitting: *Deleted

## 2017-12-01 ENCOUNTER — Encounter: Payer: Self-pay | Admitting: Urgent Care

## 2017-12-01 ENCOUNTER — Ambulatory Visit: Payer: Managed Care, Other (non HMO) | Admitting: Urgent Care

## 2017-12-01 ENCOUNTER — Ambulatory Visit (INDEPENDENT_AMBULATORY_CARE_PROVIDER_SITE_OTHER): Payer: Managed Care, Other (non HMO)

## 2017-12-01 VITALS — BP 131/80 | HR 73 | Temp 98.1°F | Resp 18 | Ht 68.0 in | Wt 187.4 lb

## 2017-12-01 DIAGNOSIS — R300 Dysuria: Secondary | ICD-10-CM

## 2017-12-01 DIAGNOSIS — R1032 Left lower quadrant pain: Secondary | ICD-10-CM

## 2017-12-01 DIAGNOSIS — R109 Unspecified abdominal pain: Secondary | ICD-10-CM

## 2017-12-01 DIAGNOSIS — R102 Pelvic and perineal pain: Secondary | ICD-10-CM

## 2017-12-01 LAB — POCT URINALYSIS DIP (MANUAL ENTRY)
BILIRUBIN UA: NEGATIVE mg/dL
Bilirubin, UA: NEGATIVE
Glucose, UA: NEGATIVE mg/dL
Leukocytes, UA: NEGATIVE
Nitrite, UA: NEGATIVE
PH UA: 5.5 (ref 5.0–8.0)
RBC UA: NEGATIVE
UROBILINOGEN UA: 0.2 U/dL

## 2017-12-01 LAB — POC MICROSCOPIC URINALYSIS (UMFC): MUCUS RE: ABSENT

## 2017-12-01 MED ORDER — TAMSULOSIN HCL 0.4 MG PO CAPS: 0.4000 mg | ORAL_CAPSULE | Freq: Every day | ORAL | 3 refills | Status: DC

## 2017-12-01 NOTE — Patient Instructions (Addendum)
We will pursue the CT abdomen pelvis to rule out an intra-abdominal process including diverticulitis versus kidney stone versus mass.  We will call you and let you know about your appointment time to get the CT.  In the meantime I want to make sure that you are hydrating very well with 64 ounces of water daily.  Please do not drink any alcohol.  Make sure you are getting plenty of fiber in your diet.  I will let you know about your lab results as they come back.    IF you received an x-ray today, you will receive an invoice from [facility]. Please contact [facility] at [phone] with questions or concerns regarding your invoice.   IF you received labwork today, you will receive an invoice from [facility]. Please contact [facility] at [phone] with questions or concerns regarding your invoice.   Our billing staff will not be able to assist you with questions regarding bills from these companies.  You will be contacted with the lab results as soon as they are available. The fastest way to get your results is to activate your My Chart account. Instructions are located on the last page of this paperwork. If you have not heard from [place] regarding the results in 2 weeks, please contact this office.

## 2017-12-01 NOTE — Progress Notes (Addendum)
MRN: [id number] DOB: [birth year]  Subjective:   Justin Kemp is a 47 y.o. male presenting for chief complaint of ~2 week history of dysuria, urinary frequency, urinary urgency, flank pain and pelvic pain, subjective fever and chills. Has tried hydrating aggressively to flush out urinary system. Is sexually active with his wife only but does not use condoms, is okay with STI testing. Denies hematuria, cloudy malordorous urine and genital rash, nausea and vomiting. Denies smoking cigarettes. Drank ~15 beers this past weekend, was celebrating with his father. Normally has ~3 beers per week. Denies constipation.  Patient reports that his flank pain is actually gone on for a couple of months and has been worsening.  Denies a history of renal stones.  Has had a colonoscopy with a polypectomy in 2018.  Naithan is not currently taking any medications. Patient has No Known Allergies. Eivin  has a past medical history of H/O colonoscopy with polypectomy (08/12/2016). Also  has a past surgical history that includes Colonoscopy w/ polypectomy (2006) and Knee arthroscopy w/ ACL reconstruction (Left).  Objective:   Vitals: BP 131/80   Pulse 73   Temp 98.1 F (36.7 C) (Oral)   Resp 18   Ht 5\' 8"  (1.727 m)   Wt 187 lb 6.4 oz (85 kg)   SpO2 97%   BMI 28.49 kg/m   Physical Exam  Constitutional: He is oriented to person, place, and time. He appears well-developed and well-nourished.  HENT:  Mouth/Throat: Oropharynx is clear and moist.  Cardiovascular: Normal rate, regular rhythm and intact distal pulses. Exam reveals no gallop and no friction rub.  No murmur heard. Pulmonary/Chest: No stridor. No respiratory distress. He has no wheezes. He has no rales.  Abdominal: Soft. Bowel sounds are normal. He exhibits no distension and no mass. There is tenderness (over area depicted). There is no rebound and no guarding.    Neurological: He is alert and oriented to person, place, and time.  Skin: Skin is  warm and dry.    Results for orders placed or performed in visit on 12/01/17 (from the past 24 hour(s))  POCT urinalysis dipstick     Status: Abnormal   Collection Time: 12/01/17  4:42 PM  Result Value Ref Range   Color, UA yellow yellow   Clarity, UA clear clear   Glucose, UA negative negative mg/dL   Bilirubin, UA negative negative   Ketones, POC UA negative negative mg/dL   Spec Grav, UA >=1.030 (A) 1.010 - 1.025   Blood, UA negative negative   pH, UA 5.5 5.0 - 8.0   Protein Ur, POC trace (A) negative mg/dL   Urobilinogen, UA 0.2 0.2 or 1.0 E.U./dL   Nitrite, UA Negative Negative   Leukocytes, UA Negative Negative  POCT Microscopic Urinalysis ([facility])     Status: None   Collection Time: 12/01/17  4:51 PM  Result Value Ref Range   WBC,UR,HPF,POC None None WBC/hpf   RBC,UR,HPF,POC None None RBC/hpf   Bacteria None None, Too numerous to count   Mucus Absent Absent   Epithelial Cells, UR Per Microscopy None None, Too numerous to count cells/hpf   Dg Abd 1 View  Result Date: 12/01/2017 CLINICAL DATA:  LEFT flank pain and pelvic pain. EXAM: ABDOMEN - 1 VIEW COMPARISON:  Plain film of the abdomen dated 11/21/2017. FINDINGS: Bowel gas pattern is nonobstructive. No evidence of soft tissue mass or abnormal fluid collection. No evidence of free intraperitoneal air. No evidence of renal or ureteral calculi. Osseous structures  are unremarkable. Lung bases appear clear. IMPRESSION: Negative. Electronically Signed   By: Franki Cabot M.D.   On: 12/01/2017 17:19     Assessment and Plan :   Left lower quadrant pain - Plan: CT Abdomen Pelvis W Contrast  Pelvic pain in male  Dysuria - Plan: POCT urinalysis dipstick, POCT Microscopic Urinalysis ([facility]), Urine Culture, DG Abd 1 View, GC/Chlamydia Probe Amp([facility]), Trichomonas vaginalis, RNA  Left flank pain  Undifferentiated left lower quadrant and left flank pain, now having pelvic pain and persistent dysuria.  We will pursue an  abdominal/pelvis CT to further investigate.  Labs pending.  Patient is aware. In the meantime, patient will try Flomax.   Jaynee Eagles, PA-C [facility] [phone] 12/01/2017 4:36 PM

## 2017-12-01 NOTE — Addendum Note (Signed)
Addended by: Jaynee Eagles on: 12/01/2017 05:45 PM   Modules accepted: Orders

## 2017-12-02 ENCOUNTER — Ambulatory Visit (HOSPITAL_COMMUNITY)
Admission: RE | Admit: 2017-12-02 | Discharge: 2017-12-02 | Disposition: A | Discharge status: Home / Self Care | Payer: Managed Care, Other (non HMO) | Source: Ambulatory Visit | Attending: Urgent Care | Admitting: Urgent Care

## 2017-12-02 DIAGNOSIS — R911 Solitary pulmonary nodule: Secondary | ICD-10-CM | POA: Insufficient documentation

## 2017-12-02 DIAGNOSIS — R1032 Left lower quadrant pain: Secondary | ICD-10-CM | POA: Insufficient documentation

## 2017-12-02 DIAGNOSIS — I700 Atherosclerosis of aorta: Secondary | ICD-10-CM | POA: Diagnosis not present

## 2017-12-02 LAB — URINE CULTURE

## 2017-12-02 MED ORDER — IOPAMIDOL (ISOVUE-300) INJECTION 61%
INTRAVENOUS | Status: AC
  Filled 2017-12-02: qty 100

## 2017-12-02 MED ORDER — IOPAMIDOL (ISOVUE-300) INJECTION 61%
100.0000 mL | Freq: Once | INTRAVENOUS | Status: AC | PRN
  Administered 2017-12-02: 100 mL via INTRAVENOUS

## 2017-12-03 LAB — GC/CHLAMYDIA PROBE AMP
Chlamydia trachomatis, NAA: NEGATIVE
NEISSERIA GONORRHOEAE BY PCR: NEGATIVE

## 2017-12-03 LAB — TRICHOMONAS VAGINALIS, PROBE AMP: Trich vag by NAA: NEGATIVE

## 2017-12-04 ENCOUNTER — Ambulatory Visit: Payer: Managed Care, Other (non HMO) | Admitting: Urgent Care

## 2017-12-04 ENCOUNTER — Encounter: Payer: Self-pay | Admitting: Urgent Care

## 2017-12-04 ENCOUNTER — Other Ambulatory Visit: Payer: Self-pay | Admitting: Urgent Care

## 2017-12-04 VITALS — BP 122/72 | HR 96 | Temp 98.0°F | Resp 16 | Ht 68.0 in | Wt 187.0 lb

## 2017-12-04 DIAGNOSIS — R918 Other nonspecific abnormal finding of lung field: Secondary | ICD-10-CM | POA: Diagnosis not present

## 2017-12-04 DIAGNOSIS — R109 Unspecified abdominal pain: Secondary | ICD-10-CM

## 2017-12-04 DIAGNOSIS — R300 Dysuria: Secondary | ICD-10-CM | POA: Diagnosis not present

## 2017-12-04 DIAGNOSIS — R1032 Left lower quadrant pain: Secondary | ICD-10-CM

## 2017-12-04 DIAGNOSIS — R102 Pelvic and perineal pain: Secondary | ICD-10-CM

## 2017-12-04 DIAGNOSIS — R911 Solitary pulmonary nodule: Secondary | ICD-10-CM | POA: Diagnosis not present

## 2017-12-04 MED ORDER — CIPROFLOXACIN HCL 500 MG PO TABS: 500.0000 mg | ORAL_TABLET | Freq: Two times a day (BID) | ORAL | 0 refills | Status: DC

## 2017-12-04 NOTE — Patient Instructions (Addendum)
Prostatitis Prostatitis is swelling or inflammation of the prostate gland. The prostate is a walnut-sized gland that is involved in the production of semen. It is located below a man's bladder, in front of the rectum. There are four types of prostatitis:  Chronic nonbacterial prostatitis. This is the most common type of prostatitis. It may be associated with a viral infection or autoimmune disorder.  Acute bacterial prostatitis. This is the least common type of prostatitis. It starts quickly and is usually associated with a bladder infection, high fever, and shaking chills. It can occur at any age.  Chronic bacterial prostatitis. This type usually results from acute bacterial prostatitis that happens repeatedly (is recurrent) or has not been treated properly. It can occur in men of any age but is most common among middle-aged men whose prostate has begun to get larger. The symptoms are not as severe as symptoms caused by acute bacterial prostatitis.  Prostatodynia or chronic pelvic pain syndrome (CPPS). This type is also called pelvic floor disorder. It is associated with increased muscular tone in the pelvis surrounding the prostate.  What are the causes? Bacterial prostatitis is caused by infection from bacteria. Chronic nonbacterial prostatitis may be caused by:  Urinary tract infections (UTIs).  Nerve damage.  A response by the body's disease-fighting system (autoimmune response).  Chemicals in the urine.  The causes of the other types of prostatitis are usually not known. What are the signs or symptoms? Symptoms of this condition vary depending upon the type of prostatitis. If you have acute bacterial prostatitis, you may experience:  Urinary symptoms, such as: ? Painful urination. ? Burning during urination. ? Frequent and sudden urges to urinate. ? Inability to start urinating. ? A weak or interrupted stream of urine.  Vomiting.  Nausea.  Fever.  Chills.  Inability to  empty the bladder completely.  Pain in the: ? Muscles or joints. ? Lower back. ? Lower abdomen.  If you have any of the other types of prostatitis, you may experience:  Urinary symptoms, such as: ? Sudden urges to urinate. ? Frequent urination. ? Difficulty starting urination. ? Weak urine stream. ? Dribbling after urination.  Discharge from the urethra. The urethra is a tube that opens at the end of the penis.  Pain in the: ? Testicles. ? Penis or tip of the penis. ? Rectum. ? Area in front of the rectum and below the scrotum (perineum).  Problems with sexual function.  Painful ejaculation.  Bloody semen.  How is this diagnosed? This condition may be diagnosed based on:  A physical and medical exam.  Your symptoms.  A urine test to check for bacteria.  An exam in which a health care provider uses a finger to feel the prostate (digital rectal exam).  A test of a sample of semen.  Blood tests.  Ultrasound.  Removal of prostate tissue to be examined under a microscope (biopsy).  Tests to check how your body handles urine (urodynamic tests).  A test to look inside your bladder or urethra (cystoscopy).  How is this treated? Treatment for this condition depends on the type of prostatitis. Treatment may involve:  Medicines to relieve pain or inflammation.  Medicines to help relax your muscles.  Physical therapy.  Heat therapy.  Techniques to help you control certain body functions (biofeedback).  Relaxation exercises.  Antibiotic medicine, if your condition is caused by bacteria.  Warm water baths (sitz baths). Sitz baths help with relaxing your pelvic floor muscles, which helps to relieve  pressure on the prostate.  Follow these instructions at home:  Take over-the-counter and prescription medicines only as told by your health care provider.  If you were prescribed an antibiotic, take it as told by your health care provider. Do not stop taking the  antibiotic even if you start to feel better.  If physical therapy, biofeedback, or relaxation exercises were prescribed, do exercises as instructed.  Take sitz baths as directed by your health care provider. For a sitz bath, sit in warm water that is deep enough to cover your hips and buttocks.  Keep all follow-up visits as told by your health care provider. This is important. Contact a health care provider if:  Your symptoms get worse.  You have a fever. Get help right away if:  You have chills.  You feel nauseous.  You vomit.  You feel light-headed or feel like you are going to faint.  You are unable to urinate.  You have blood or blood clots in your urine. This information is not intended to replace advice given to you by your health care provider. Make sure you discuss any questions you have with your health care provider. Document Released: 06/07/2000 Document Revised: 02/29/2016 Document Reviewed: 02/29/2016 Elsevier Interactive Patient Education  2017 [employer]     Pulmonary Nodule A pulmonary nodule is a small, round growth of tissue in the lung. Pulmonary nodules can range in size from less than 1/5 inch (4 mm) to a little bigger than an inch (25 mm). Most pulmonary nodules are detected when imaging tests of the lung are being performed for a different problem. Pulmonary nodules are usually not cancerous (benign). However, some pulmonary nodules are cancerous (malignant). Follow-up treatment or testing is based on the size of the pulmonary nodule and your risk of getting lung cancer. What are the causes? Benign pulmonary nodules can be caused by various things. Some of the causes include:  Bacterial, fungal, or viral infections. This is usually an old infection that is no longer active, but it can sometimes be a current, active infection.  A benign mass of tissue.  Inflammation from conditions such as rheumatoid arthritis.  Abnormal blood vessels in the  lungs.  Malignant pulmonary nodules can result from lung cancer or from cancers that spread to the lung from other places in the body. What are the signs or symptoms? Pulmonary nodules usually do not cause symptoms. How is this diagnosed? Most often, pulmonary nodules are found incidentally when an X-ray or CT scan is performed to look for some other problem in the lung area. To help determine whether a pulmonary nodule is benign or malignant, your health care provider will take a medical history and order a variety of tests. Tests done may include:  Blood tests.  A skin test called a tuberculin test. This test is used to determine if you have been exposed to the germ that causes tuberculosis.  Chest X-rays. If possible, a new X-ray may be compared with X-rays you have had in the past.  CT scan. This test shows smaller pulmonary nodules more clearly than an X-ray.  Positron emission tomography (PET) scan. In this test, a safe amount of a radioactive substance is injected into the bloodstream. Then, the scan takes a picture of the pulmonary nodule. The radioactive substance is eliminated from your body in your urine.  Biopsy. A tiny piece of the pulmonary nodule is removed so it can be checked under a microscope.  How is this treated? Pulmonary  nodules that are benign normally do not require any treatment because they usually do not cause symptoms or breathing problems. Your health care provider may want to monitor the pulmonary nodule through follow-up CT scans. The frequency of these CT scans will vary based on the size of the nodule and the risk factors for lung cancer. For example, CT scans will need to be done more frequently if the pulmonary nodule is larger and if you have a history of smoking and a family history of cancer. Further testing or biopsies may be done if any follow-up CT scan shows that the size of the pulmonary nodule has increased. Follow these instructions at home:  Only  take over-the-counter or prescription medicines as directed by your health care provider.  Keep all follow-up appointments with your health care provider. Contact a health care provider if:  You have trouble breathing when you are active.  You feel sick or unusually tired.  You do not feel like eating.  You lose weight without trying to.  You develop chills or night sweats. Get help right away if:  You cannot catch your breath, or you begin wheezing.  You cannot stop coughing.  You cough up blood.  You become dizzy or feel like you are going to pass out.  You have sudden chest pain.  You have a fever or persistent symptoms for more than 2-3 days.  You have a fever and your symptoms suddenly get worse. This information is not intended to replace advice given to you by your health care provider. Make sure you discuss any questions you have with your health care provider. Document Released: 04/07/2009 Document Revised: 11/16/2015 Document Reviewed: 11/30/2012 Elsevier Interactive Patient Education  2017 [employer].     IF you received an x-ray today, you will receive an invoice from [facility]. Please contact [facility] at [phone] with questions or concerns regarding your invoice.   IF you received labwork today, you will receive an invoice from [place]. Please contact [facility] at [phone] with questions or concerns regarding your invoice.   Our billing staff will not be able to assist you with questions regarding bills from these companies.  You will be contacted with the lab results as soon as they are available. The fastest way to get your results is to activate your My Chart account. Instructions are located on the last page of this paperwork. If you have not heard from [place] regarding the results in 2 weeks, please contact this office.

## 2017-12-04 NOTE — Progress Notes (Signed)
    MRN: [id number] DOB: 1970/12/15  Subjective:   Justin Kemp is a 47 y.o. male presenting for follow up on left flank pain left sided abdominal pain, pelvic pain, dysuria and urinary frequency.  At his last office visit on 12/01/2017, we decided to pursue a abdominal and pelvis CT scan.  The results showed that there was a solitary pulmonary nodule on the right side at lower base of his lung.  Also suggested that patient may have prostatitis, recommendation was to perform PSA level.  Today, patient reports that his symptoms have completely resolved except for the left flank/abdominal pain.  Denies fever, nausea, vomiting, dysuria, urinary frequency, pelvic pain, hematuria.  He has used Flomax since we prescribed it on the 10th.  Denies any issues with this medication.  Justin Kemp has a current medication list which includes the following prescription(s): tamsulosin. Also has No Known Allergies.  Justin Kemp  has a past medical history of H/O colonoscopy with polypectomy (08/12/2016). Also  has a past surgical history that includes Colonoscopy w/ polypectomy (2006) and Knee arthroscopy w/ ACL reconstruction (Left).  Objective:   Vitals: BP 122/72   Pulse 96   Temp 98 F (36.7 C) (Oral)   Resp 16   Ht 5\' 8"  (1.727 m)   Wt 187 lb (84.8 kg)   SpO2 96%   BMI 28.43 kg/m   Physical Exam  Constitutional: He is oriented to person, place, and time. He appears well-developed and well-nourished.  Cardiovascular: Normal rate.  Pulmonary/Chest: Effort normal.  Abdominal: Soft. Bowel sounds are normal. He exhibits no distension and no mass. There is no tenderness. There is no rebound and no guarding.  Genitourinary: Prostate normal. Prostate is not enlarged and not tender.  Neurological: He is alert and oriented to person, place, and time.  Skin: Skin is warm and dry.  Psychiatric: He has a normal mood and affect.   Assessment and Plan :   Pelvic pain in male - Plan: PSA  Left lower quadrant  pain  Dysuria  Left flank pain  Solitary pulmonary nodule  Discussed CT findings at length.  I cannot definitively explain why patient's symptoms resolved but will continue to monitor closely.  Provided patient with prescription for ciprofloxacin in case his symptoms return and at that point recommend that he start the antibiotic to address prostatitis.  PSA levels pending.  Plan is to repeat his chest CT in 6 months.  Return to clinic precautions discussed.  Patient is in agreement with treatment plan.  Jaynee Eagles, PA-C [facility] [phone] 12/04/2017 3:40 PM

## 2017-12-05 LAB — PSA: PROSTATE SPECIFIC AG, SERUM: 28.2 ng/mL — AB (ref 0.0–4.0)

## 2017-12-06 ENCOUNTER — Telehealth: Payer: Self-pay | Admitting: Urgent Care

## 2017-12-06 NOTE — Telephone Encounter (Signed)
Christella Scheuermann [employer] has approved the CT abdomin w/ contrast.  Valid 12/02/17- 03/02/18 Auth #: [id number] Reference code: [id number]

## 2019-12-20 IMAGING — CT CT ABD-PELV W/ CM
2 of 5 series · 16 of 46 positions shown, 18 images · IV contrast (ISOVUE 300)
Comparison: Plain films of 1 day prior.  No prior CT.

CLINICAL DATA: Left lower quadrant pain for 2 months. Dysuria.
Occasional fever.

EXAM:
CT ABDOMEN AND PELVIS WITH CONTRAST
TECHNIQUE: Multidetector CT imaging of the abdomen and pelvis was performed
using the standard protocol following bolus administration of
intravenous contrast.
CONTRAST:  100mL [id number] IOPAMIDOL ([id number]) INJECTION 61%

[Series 2: axial st · axial · 0.83mm/px · z∈[+1161,+1566]mm · 13 of 95 slices shown, 15 images]
[im 7/95  soft-tissue]
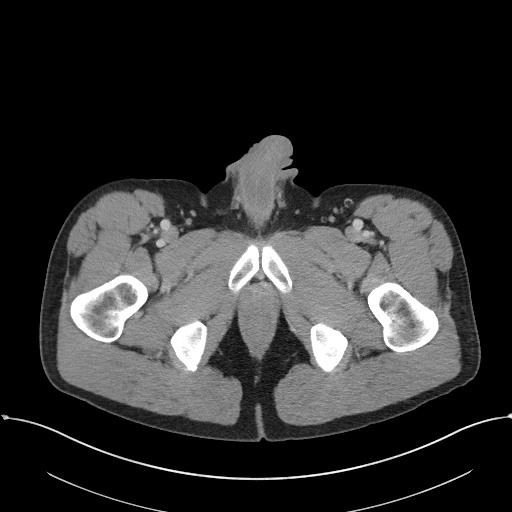
[im 7/95  bone]
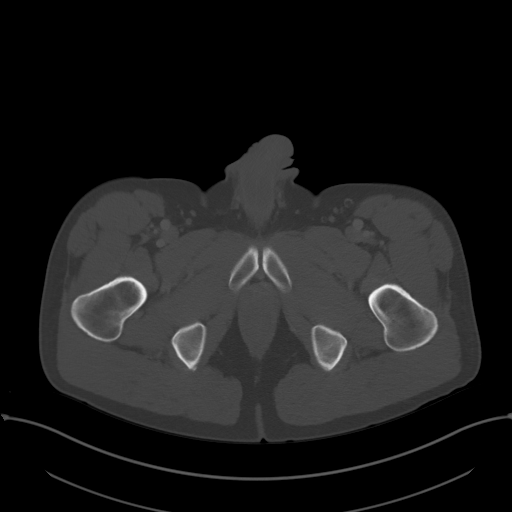
[im 13/95  soft-tissue]
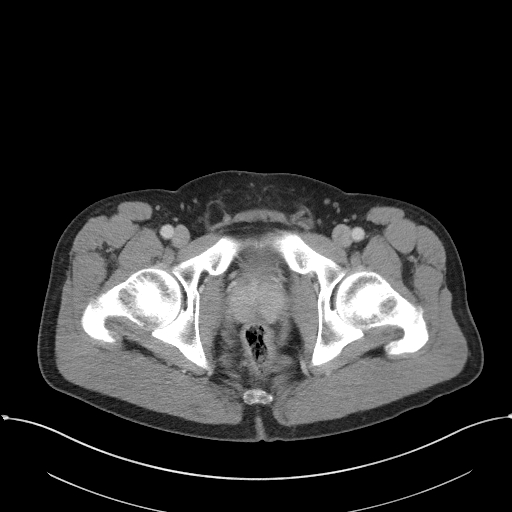
[im 19/95  soft-tissue]
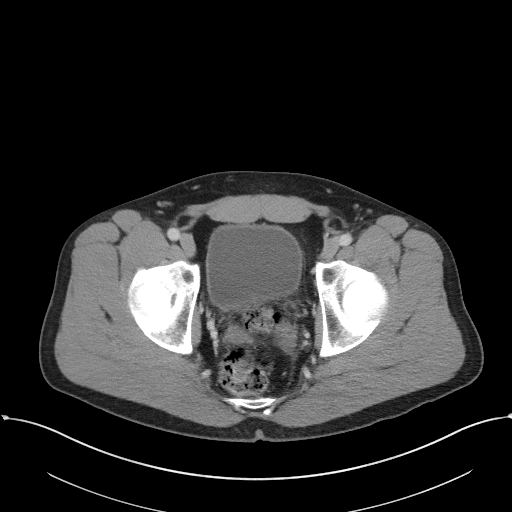
[im 26/95  soft-tissue]
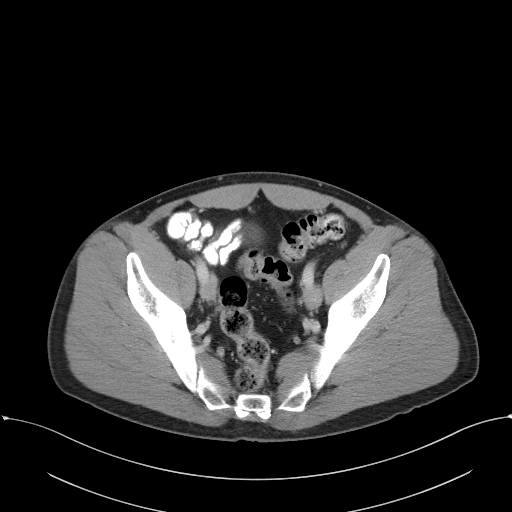
[im 32/95  soft-tissue]
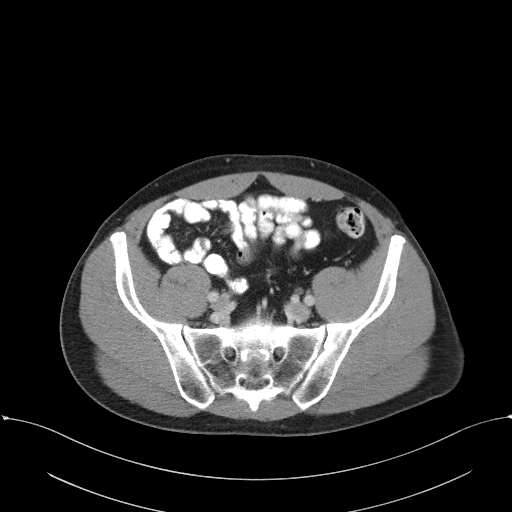
[im 38/95  soft-tissue]
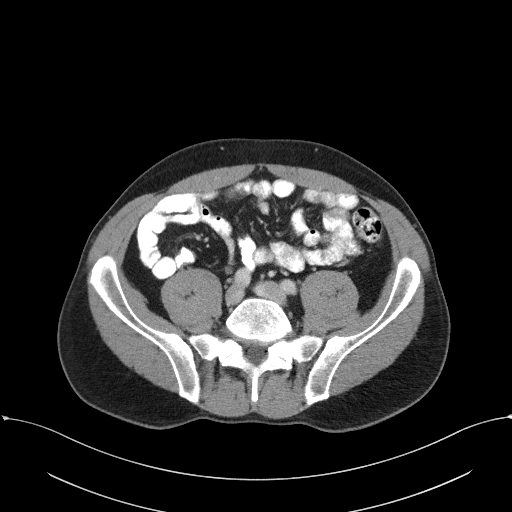
[im 51/95  soft-tissue]
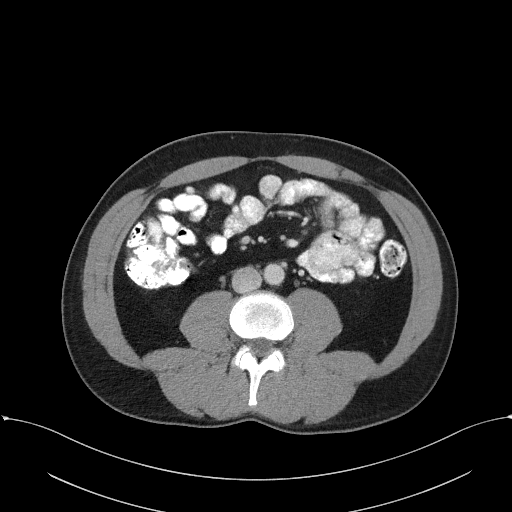
[im 57/95  soft-tissue]
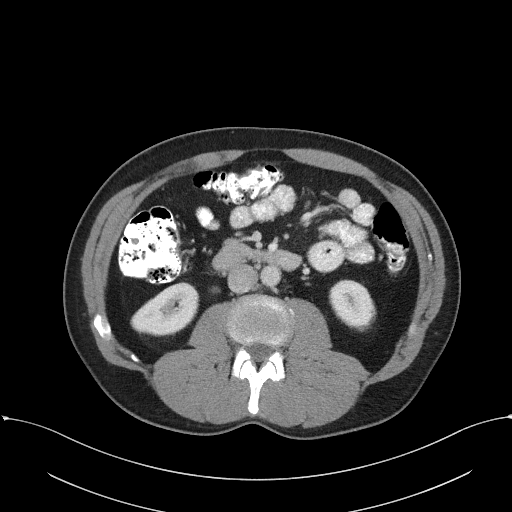
[im 63/95  soft-tissue]
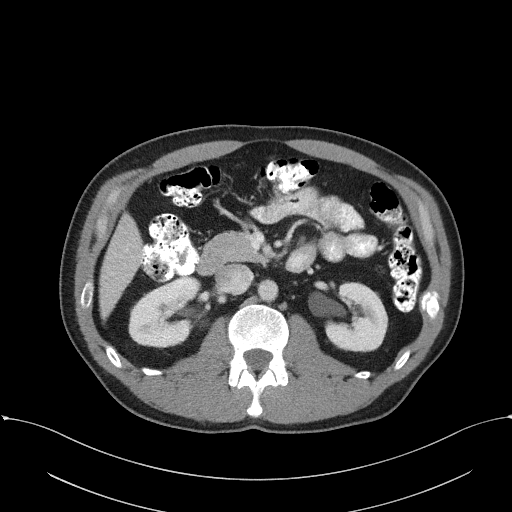
[im 63/95  bone]
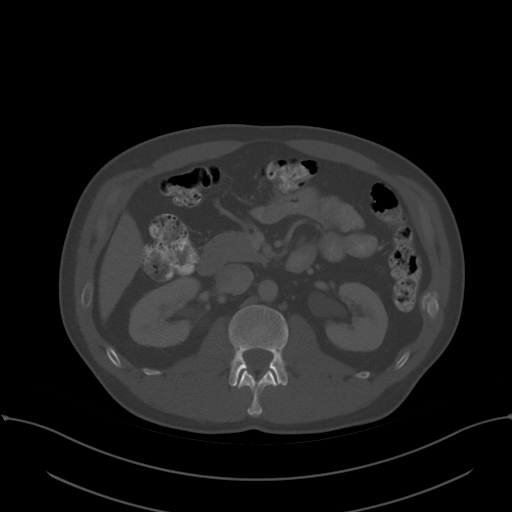
[im 69/95  soft-tissue]
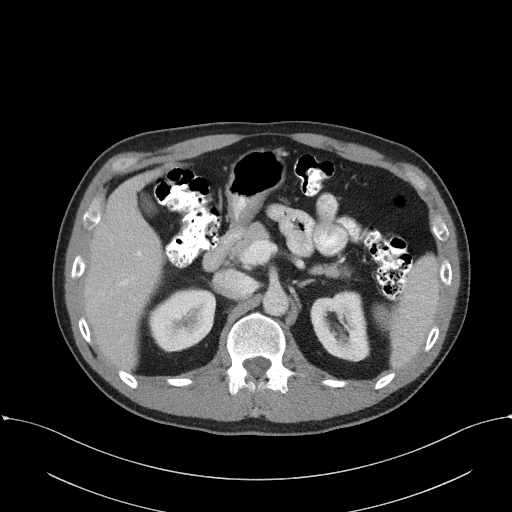
[im 76/95  soft-tissue]
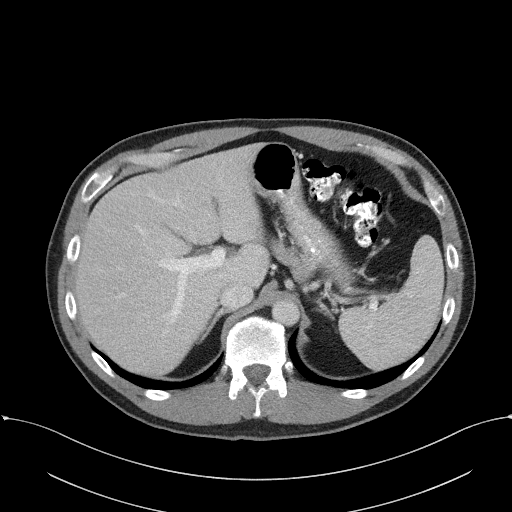
[im 82/95  soft-tissue]
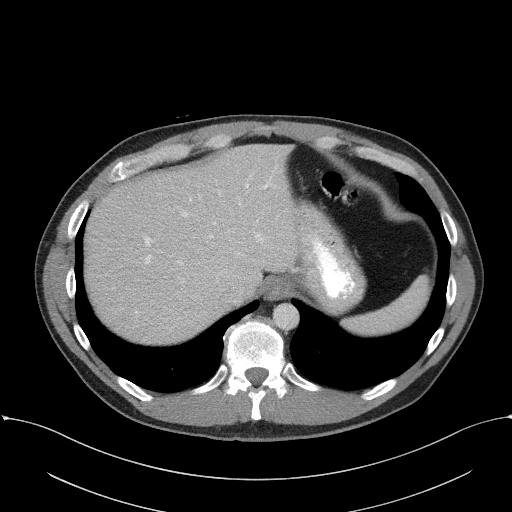
[im 88/95  soft-tissue]
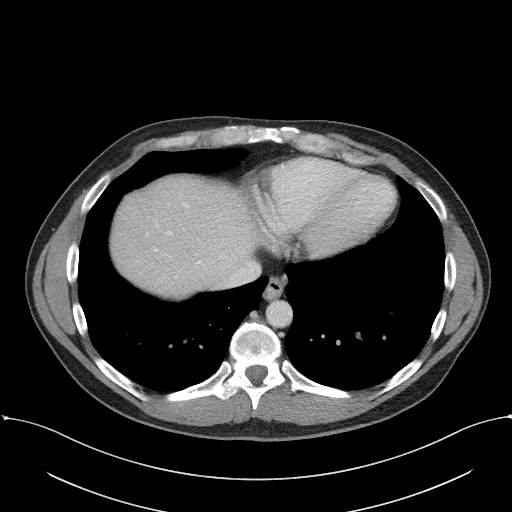

[Series 4: coronal st · coronal · 0.79mm/px · 3 of 83 slices shown]
[im 28/83  soft-tissue]
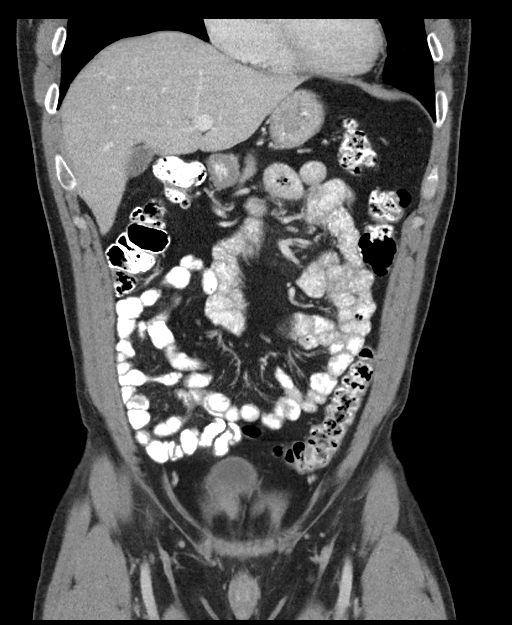
[im 37/83  soft-tissue]
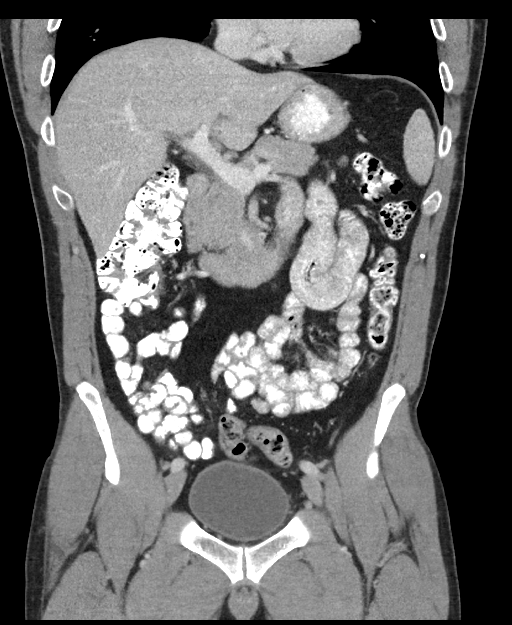
[im 46/83  soft-tissue]
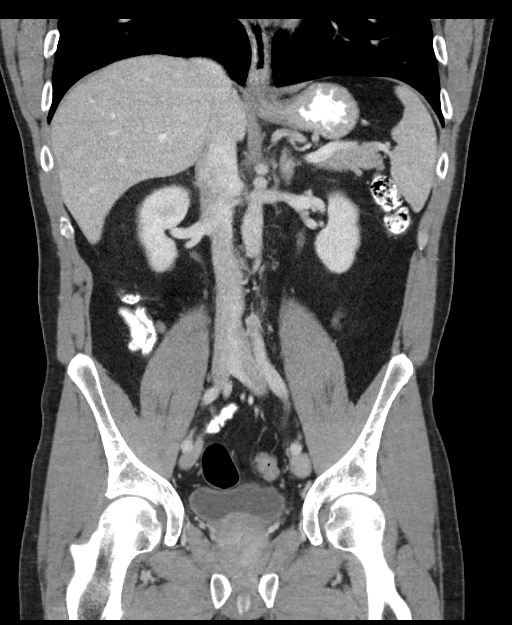

[16 of 46 positions shown; findings below may reference images not displayed]

FINDINGS: Lower chest: Pleural-based right lower lobe pulmonary nodule of 6 mm
on image [DATE]. Normal heart size without pericardial or pleural
effusion.

Hepatobiliary: Normal liver. Normal gallbladder, without biliary
ductal dilatation.

Pancreas: Normal, without mass or ductal dilatation.

Spleen: Normal in size, without focal abnormality.

Adrenals/Urinary Tract: Normal adrenal glands. Right renal too small
to characterize lesion. Normal left kidney, without hydronephrosis
or hydroureter.

Stomach/Bowel: Normal stomach, without wall thickening. Normal
colon, appendix, and terminal ileum. Normal small bowel.

Vascular/Lymphatic: Aortic atherosclerosis. No abdominopelvic
adenopathy.

Reproductive: Possible heterogeneously hyperenhancing peripheral
zone of the prostate, slightly greater on the left. Example image
83/2. extension in the left prostatic apex on image 86/2. Small
bilateral hydroceles.

Other: No significant free fluid.  No free intraperitoneal air.

Musculoskeletal: Degenerate disc disease at the lumbosacral
junction.
IMPRESSION: 1. Subtle heterogeneous hyperenhancement suspected throughout the
peripheral zone of the prostate. Given the clinical history,
prostatitis could have this appearance. Consider physical exam and
possibly PSA correlation.
2. Otherwise, no explanation for patient's symptoms.
3.  Aortic Atherosclerosis ([id number].J).
4. Right lower lobe subpleural pulmonary nodule. Non-contrast chest
CT at 6-12 months is recommended. If the nodule is stable at time of
repeat CT, then future CT at 18-24 months (from today's scan) is
considered optional for low-risk patients, but is recommended for
high-risk patients. This recommendation follows the consensus
statement: Guidelines for Management of Incidental Pulmonary Nodules
Detected on CT Images: From the [HOSPITAL] 1795; Radiology

## 2020-01-23 DIAGNOSIS — U071 COVID-19: Secondary | ICD-10-CM

## 2020-01-23 HISTORY — DX: COVID-19: U07.1

## 2020-04-06 ENCOUNTER — Other Ambulatory Visit: Payer: Self-pay

## 2020-04-06 ENCOUNTER — Ambulatory Visit (AMBULATORY_SURGERY_CENTER): Payer: Self-pay | Admitting: *Deleted

## 2020-04-06 VITALS — Ht 68.0 in | Wt 186.0 lb

## 2020-04-06 DIAGNOSIS — Z800 Family history of malignant neoplasm of digestive organs: Secondary | ICD-10-CM

## 2020-04-06 DIAGNOSIS — Z86010 Personal history of colonic polyps: Secondary | ICD-10-CM

## 2020-04-06 MED ORDER — SUTAB 1479-225-188 MG PO TABS
24.0000 | ORAL_TABLET | ORAL | 0 refills | Status: DC
Start: 2020-04-06 — End: 2020-04-11

## 2020-04-06 NOTE — Progress Notes (Signed)

## 2020-04-07 ENCOUNTER — Encounter: Payer: Self-pay | Admitting: Gastroenterology

## 2020-04-11 ENCOUNTER — Encounter: Payer: Self-pay | Admitting: Gastroenterology

## 2020-04-11 ENCOUNTER — Ambulatory Visit (AMBULATORY_SURGERY_CENTER): Payer: Managed Care, Other (non HMO) | Admitting: Gastroenterology

## 2020-04-11 ENCOUNTER — Other Ambulatory Visit: Payer: Self-pay

## 2020-04-11 VITALS — BP 108/75 | HR 78 | Temp 97.7°F | Resp 14 | Ht 68.0 in | Wt 186.0 lb

## 2020-04-11 DIAGNOSIS — Z800 Family history of malignant neoplasm of digestive organs: Secondary | ICD-10-CM | POA: Diagnosis not present

## 2020-04-11 DIAGNOSIS — D123 Benign neoplasm of transverse colon: Secondary | ICD-10-CM

## 2020-04-11 DIAGNOSIS — D120 Benign neoplasm of cecum: Secondary | ICD-10-CM

## 2020-04-11 DIAGNOSIS — K5289 Other specified noninfective gastroenteritis and colitis: Secondary | ICD-10-CM

## 2020-04-11 DIAGNOSIS — Z86010 Personal history of colonic polyps: Secondary | ICD-10-CM

## 2020-04-11 MED ORDER — SODIUM CHLORIDE 0.9 % IV SOLN: 500.0000 mL | Freq: Once | INTRAVENOUS | Status: DC

## 2020-04-11 NOTE — Progress Notes (Signed)
Called to room to assist during endoscopic procedure.  Patient ID and intended procedure confirmed with present staff. Received instructions for my participation in the procedure from the performing physician.  

## 2020-04-11 NOTE — Patient Instructions (Addendum)
Handouts were given to you on polyps, diverticulosis, and  You may resume your current medications today. Await biopsy results.  Usually takes 2-3 weeks to receive pathology results. Please call if any questions or concerns.     YOU HAD AN ENDOSCOPIC PROCEDURE TODAY AT [facility]:   Refer to the procedure report that was given to you for any specific questions about what was found during the examination.  If the procedure report does not answer your questions, please call your gastroenterologist to clarify.  If you requested that your care partner not be given the details of your procedure findings, then the procedure report has been included in a sealed envelope for you to review at your convenience later.  YOU SHOULD EXPECT: Some feelings of bloating in the abdomen. Passage of more gas than usual.  Walking can help get rid of the air that was put into your GI tract during the procedure and reduce the bloating. If you had a lower endoscopy (such as a colonoscopy or flexible sigmoidoscopy) you may notice spotting of blood in your stool or on the toilet paper. If you underwent a bowel prep for your procedure, you may not have a normal bowel movement for a few days.  Please Note:  You might notice some irritation and congestion in your nose or some drainage.  This is from the oxygen used during your procedure.  There is no need for concern and it should clear up in a day or so.  SYMPTOMS TO REPORT IMMEDIATELY:   Following lower endoscopy (colonoscopy or flexible sigmoidoscopy):  Excessive amounts of blood in the stool  Significant tenderness or worsening of abdominal pains  Swelling of the abdomen that is new, acute  Fever of 100F or higher   For urgent or emergent issues, a gastroenterologist can be reached at any hour by calling [phone]. Do not use MyChart messaging for urgent concerns.    DIET:  We do recommend a small meal at first, but then you may proceed to  your regular diet.  Drink plenty of fluids but you should avoid alcoholic beverages for 24 hours.  ACTIVITY:  You should plan to take it easy for the rest of today and you should NOT DRIVE or use heavy machinery until tomorrow (because of the sedation medicines used during the test).    FOLLOW UP: Our staff will call the number listed on your records 48-72 hours following your procedure to check on you and address any questions or concerns that you may have regarding the information given to you following your procedure. If we do not reach you, we will leave a message.  We will attempt to reach you two times.  During this call, we will ask if you have developed any symptoms of COVID 19. If you develop any symptoms (ie: fever, flu-like symptoms, shortness of breath, cough etc.) before then, please call [phone].  If you test positive for Covid 19 in the 2 weeks post procedure, please call and report this information to [place].    If any biopsies were taken you will be contacted by phone or by letter within the next 1-3 weeks.  Please call us at [phone] if you have not heard about the biopsies in 3 weeks.    SIGNATURES/CONFIDENTIALITY: You and/or your care partner have signed paperwork which will be entered into your electronic medical record.  These signatures attest to the fact that that the information above on your After Visit Summary  has been reviewed and is understood.  Full responsibility of the confidentiality of this discharge information lies with you and/or your care-partner.  Discharge instructions given. Handouts on polyps,diverticulosis and hemorrhoids. Resume previous medications. YOU HAD AN ENDOSCOPIC PROCEDURE TODAY AT [facility]:   Refer to the procedure report that was given to you for any specific questions about what was found during the examination.  If the procedure report does not answer your questions, please call your gastroenterologist to clarify.   If you requested that your care partner not be given the details of your procedure findings, then the procedure report has been included in a sealed envelope for you to review at your convenience later.  YOU SHOULD EXPECT: Some feelings of bloating in the abdomen. Passage of more gas than usual.  Walking can help get rid of the air that was put into your GI tract during the procedure and reduce the bloating. If you had a lower endoscopy (such as a colonoscopy or flexible sigmoidoscopy) you may notice spotting of blood in your stool or on the toilet paper. If you underwent a bowel prep for your procedure, you may not have a normal bowel movement for a few days.  Please Note:  You might notice some irritation and congestion in your nose or some drainage.  This is from the oxygen used during your procedure.  There is no need for concern and it should clear up in a day or so.  SYMPTOMS TO REPORT IMMEDIATELY:   Following lower endoscopy (colonoscopy or flexible sigmoidoscopy):  Excessive amounts of blood in the stool  Significant tenderness or worsening of abdominal pains  Swelling of the abdomen that is new, acute  Fever of 100F or higher   For urgent or emergent issues, a gastroenterologist can be reached at any hour by calling [phone]. Do not use MyChart messaging for urgent concerns.    DIET:  We do recommend a small meal at first, but then you may proceed to your regular diet.  Drink plenty of fluids but you should avoid alcoholic beverages for 24 hours.  ACTIVITY:  You should plan to take it easy for the rest of today and you should NOT DRIVE or use heavy machinery until tomorrow (because of the sedation medicines used during the test).    FOLLOW UP: Our staff will call the number listed on your records 48-72 hours following your procedure to check on you and address any questions or concerns that you may have regarding the information given to you following your procedure. If we do  not reach you, we will leave a message.  We will attempt to reach you two times.  During this call, we will ask if you have developed any symptoms of COVID 19. If you develop any symptoms (ie: fever, flu-like symptoms, shortness of breath, cough etc.) before then, please call [phone].  If you test positive for Covid 19 in the 2 weeks post procedure, please call and report this information to [place].    If any biopsies were taken you will be contacted by phone or by letter within the next 1-3 weeks.  Please call us at [phone] if you have not heard about the biopsies in 3 weeks.    SIGNATURES/CONFIDENTIALITY: You and/or your care partner have signed paperwork which will be entered into your electronic medical record.  These signatures attest to the fact that that the information above on your After Visit Summary has been reviewed and is  understood.  Full responsibility of the confidentiality of this discharge information lies with you and/or your care-partner. 

## 2020-04-11 NOTE — Op Note (Signed)
[facility] Patient Name: Justin Kemp Procedure Date: 04/11/2020 12:06 PM MRN: [id number] Endoscopist: Remo Lipps P. Havery Moros , MD Age: 49 Referring MD:  Date of Birth: 22-Dec-1970 Gender: Male Account #: [id number] Procedure:                Colonoscopy Indications:              High risk colon cancer surveillance: Personal                            history of colonic polyps (rectal adenoma with HGD                            removed in 2018), father with colon cancer dx at                            age 83 Medicines:                Monitored Anesthesia Care Procedure:                Pre-Anesthesia Assessment:                           - Prior to the procedure, a History and Physical                            was performed, and patient medications and                            allergies were reviewed. The patient's tolerance of                            previous anesthesia was also reviewed. The risks                            and benefits of the procedure and the sedation                            options and risks were discussed with the patient.                            All questions were answered, and informed consent                            was obtained. Prior Anticoagulants: The patient has                            taken no previous anticoagulant or antiplatelet                            agents. ASA Grade Assessment: II - A patient with                            mild systemic disease. After reviewing the risks  and benefits, the patient was deemed in                            satisfactory condition to undergo the procedure.                           After obtaining informed consent, the colonoscope                            was passed under direct vision. Throughout the                            procedure, the patient's blood pressure, pulse, and                            oxygen saturations were monitored continuously. The                             Colonoscope was introduced through the anus and                            advanced to the the terminal ileum, with                            identification of the appendiceal orifice and IC                            valve. The colonoscopy was performed without                            difficulty. The patient tolerated the procedure                            well. The quality of the bowel preparation was                            good. The terminal ileum, ileocecal valve,                            appendiceal orifice, and rectum were photographed. Scope In: 12:17:46 PM Scope Out: 12:34:12 PM Scope Withdrawal Time: 0 hours 13 minutes 15 seconds  Total Procedure Duration: 0 hours 16 minutes 26 seconds  Findings:                 The perianal and digital rectal examinations were                            normal.                           The terminal ileum appeared normal.                           A 3 mm polyp was found in the cecum. The polyp was  flat. The polyp was removed with a cold biopsy                            forceps. Resection and retrieval were complete.                           Localized area perhaps 1cm in size with mild                            inflammation characterized by erosions, erythema                            and friability was found in the cecum. Biopsies                            were taken with a cold forceps for histology.                           A diminutive polyp was found in the transverse                            colon. The polyp was sessile. The polyp was removed                            with a cold biopsy forceps. Resection and retrieval                            were complete.                           A few small-mouthed diverticula were found in the                            sigmoid colon.                           A medium post polypectomy scar was found in the                             rectum. The scar tissue was healthy in appearance.                            There was no evidence of the previous polyp. Prior                            tattoo noted                           Internal hemorrhoids were found during retroflexion.                           The exam was otherwise without abnormality. Complications:            No immediate complications. Estimated blood loss:  Minimal. Estimated Blood Loss:     Estimated blood loss was minimal. Impression:               - The examined portion of the ileum was normal.                           - One 3 mm polyp in the cecum, removed with a cold                            biopsy forceps. Resected and retrieved.                           - Localized mild inflammation was found in the                            cecum. Biopsied.                           - One diminutive polyp in the transverse colon,                            removed with a cold biopsy forceps. Resected and                            retrieved.                           - Diverticulosis in the sigmoid colon.                           - Post-polypectomy scar in the rectum, well healed.                           - Internal hemorrhoids.                           - The examination was otherwise normal. Recommendation:           - Patient has a contact number available for                            emergencies. The signs and symptoms of potential                            delayed complications were discussed with the                            patient. Return to normal activities tomorrow.                            Written discharge instructions were provided to the                            patient.                           -  Resume previous diet.                           - Continue present medications.                           - Await pathology results. Anticipate repeat                            colonoscopy in 5 years for  surveillance purposes Justin Kemp. Justin Neuner, MD 04/11/2020 12:39:40 PM This report has been signed electronically.

## 2020-04-11 NOTE — Progress Notes (Signed)
Report given to PACU, vss 

## 2020-04-11 NOTE — Progress Notes (Signed)
Pt's states no medical or surgical changes since previsit or office visit.  CW - vitals 

## 2020-04-13 ENCOUNTER — Telehealth: Payer: Self-pay

## 2020-04-13 NOTE — Telephone Encounter (Signed)
  Follow up Call-  Call back number 04/11/2020  Post procedure Call Back phone  # [phone]  Permission to leave phone message No  Some recent data might be hidden     Patient questions:  Do you have a fever, pain , or abdominal swelling? No. Pain Score  0 *  Have you tolerated food without any problems? Yes.    Have you been able to return to your normal activities? Yes.    Do you have any questions about your discharge instructions: Diet   No. Medications  No. Follow up visit  No.  Do you have questions or concerns about your Care? No.  Actions: * If pain score is 4 or above: No action needed, pain <4.  1. Have you developed a fever since your procedure? no  2.   Have you had an respiratory symptoms (SOB or cough) since your procedure? no  3.   Have you tested positive for COVID 19 since your procedure no  4.   Have you had any family members/close contacts diagnosed with the COVID 19 since your procedure?  no   If yes to any of these questions please route to Joylene John, RN and Joella Prince, RN

## 2020-04-19 ENCOUNTER — Encounter: Payer: Self-pay | Admitting: Gastroenterology

## 2024-06-14 ENCOUNTER — Emergency Department (HOSPITAL_BASED_OUTPATIENT_CLINIC_OR_DEPARTMENT_OTHER)
Admission: EM | Admit: 2024-06-14 | Discharge: 2024-06-14 | Discharge status: Against Medical Advice | Attending: Emergency Medicine | Admitting: Emergency Medicine

## 2024-06-14 ENCOUNTER — Encounter (HOSPITAL_BASED_OUTPATIENT_CLINIC_OR_DEPARTMENT_OTHER): Payer: Self-pay

## 2024-06-14 ENCOUNTER — Emergency Department (HOSPITAL_BASED_OUTPATIENT_CLINIC_OR_DEPARTMENT_OTHER)

## 2024-06-14 ENCOUNTER — Other Ambulatory Visit: Payer: Self-pay

## 2024-06-14 DIAGNOSIS — Z5321 Procedure and treatment not carried out due to patient leaving prior to being seen by health care provider: Secondary | ICD-10-CM | POA: Insufficient documentation

## 2024-06-14 DIAGNOSIS — S61212A Laceration without foreign body of right middle finger without damage to nail, initial encounter: Secondary | ICD-10-CM | POA: Insufficient documentation

## 2024-06-14 DIAGNOSIS — W268XXA Contact with other sharp object(s), not elsewhere classified, initial encounter: Secondary | ICD-10-CM | POA: Insufficient documentation

## 2024-06-14 NOTE — ED Triage Notes (Signed)
 Pt c/o continued bleeding from cut on R middle finger. States he was using potato slicer Friday night, shaved off the end of it. Advises bleeding is just not stopping, but I don't know if it's spurting or oozing.   Tylenol for pain
